# Patient Record
Sex: Male | Born: 1970
Health system: Southern US, Community
[De-identification: ages and names within clinical notes are randomized; demographics above are authoritative.]

## PROBLEM LIST (undated history)

## (undated) DIAGNOSIS — J449 Chronic obstructive pulmonary disease, unspecified: Secondary | ICD-10-CM

## (undated) HISTORY — PX: KNEE CARTILAGE SURGERY: SHX688

---

## 2004-11-30 ENCOUNTER — Emergency Department (HOSPITAL_COMMUNITY): Admission: EM | Admit: 2004-11-30 | Discharge: 2004-11-30 | Payer: Self-pay | Admitting: Emergency Medicine

## 2007-06-09 ENCOUNTER — Emergency Department (HOSPITAL_COMMUNITY): Admission: EM | Admit: 2007-06-09 | Discharge: 2007-06-10 | Payer: Self-pay | Admitting: Emergency Medicine

## 2007-12-08 ENCOUNTER — Emergency Department (HOSPITAL_COMMUNITY): Admission: EM | Admit: 2007-12-08 | Discharge: 2007-12-09 | Payer: Self-pay | Admitting: Emergency Medicine

## 2011-06-02 ENCOUNTER — Ambulatory Visit
Admission: RE | Admit: 2011-06-02 | Discharge: 2011-06-02 | Disposition: A | Discharge status: Home / Self Care | Payer: 59 | Source: Ambulatory Visit | Attending: Chiropractic Medicine | Admitting: Chiropractic Medicine

## 2011-06-02 ENCOUNTER — Other Ambulatory Visit: Payer: Self-pay | Admitting: Chiropractic Medicine

## 2011-06-02 DIAGNOSIS — R52 Pain, unspecified: Secondary | ICD-10-CM

## 2011-06-02 DIAGNOSIS — M545 Low back pain, unspecified: Secondary | ICD-10-CM

## 2011-06-02 DIAGNOSIS — M5412 Radiculopathy, cervical region: Secondary | ICD-10-CM

## 2012-05-04 ENCOUNTER — Other Ambulatory Visit (HOSPITAL_BASED_OUTPATIENT_CLINIC_OR_DEPARTMENT_OTHER): Payer: Self-pay | Admitting: Internal Medicine

## 2012-05-04 ENCOUNTER — Ambulatory Visit (HOSPITAL_BASED_OUTPATIENT_CLINIC_OR_DEPARTMENT_OTHER)
Admission: RE | Admit: 2012-05-04 | Discharge: 2012-05-04 | Disposition: A | Discharge status: Home / Self Care | Payer: 59 | Source: Ambulatory Visit | Attending: Internal Medicine | Admitting: Internal Medicine

## 2012-05-04 ENCOUNTER — Encounter (HOSPITAL_BASED_OUTPATIENT_CLINIC_OR_DEPARTMENT_OTHER): Payer: Self-pay

## 2012-05-04 DIAGNOSIS — Z87442 Personal history of urinary calculi: Secondary | ICD-10-CM | POA: Insufficient documentation

## 2012-05-04 DIAGNOSIS — R52 Pain, unspecified: Secondary | ICD-10-CM

## 2012-05-04 DIAGNOSIS — R300 Dysuria: Secondary | ICD-10-CM | POA: Insufficient documentation

## 2012-05-04 MED ORDER — IOHEXOL 300 MG/ML  SOLN
100.0000 mL | Freq: Once | INTRAMUSCULAR | Status: AC | PRN
  Administered 2012-05-04: 100 mL via INTRAVENOUS

## 2015-07-18 DIAGNOSIS — Z79899 Other long term (current) drug therapy: Secondary | ICD-10-CM | POA: Diagnosis not present

## 2015-07-18 DIAGNOSIS — M5136 Other intervertebral disc degeneration, lumbar region: Secondary | ICD-10-CM | POA: Diagnosis not present

## 2015-07-18 DIAGNOSIS — M545 Low back pain: Secondary | ICD-10-CM | POA: Diagnosis not present

## 2015-07-18 DIAGNOSIS — F411 Generalized anxiety disorder: Secondary | ICD-10-CM | POA: Diagnosis not present

## 2015-07-18 DIAGNOSIS — M542 Cervicalgia: Secondary | ICD-10-CM | POA: Diagnosis not present

## 2015-08-01 DIAGNOSIS — H40053 Ocular hypertension, bilateral: Secondary | ICD-10-CM | POA: Diagnosis not present

## 2015-08-15 DIAGNOSIS — M545 Low back pain: Secondary | ICD-10-CM | POA: Diagnosis not present

## 2015-08-15 DIAGNOSIS — M542 Cervicalgia: Secondary | ICD-10-CM | POA: Diagnosis not present

## 2015-08-15 DIAGNOSIS — Z79899 Other long term (current) drug therapy: Secondary | ICD-10-CM | POA: Diagnosis not present

## 2015-08-15 DIAGNOSIS — M5136 Other intervertebral disc degeneration, lumbar region: Secondary | ICD-10-CM | POA: Diagnosis not present

## 2015-08-15 DIAGNOSIS — M25512 Pain in left shoulder: Secondary | ICD-10-CM | POA: Diagnosis not present

## 2015-09-12 DIAGNOSIS — S43005A Unspecified dislocation of left shoulder joint, initial encounter: Secondary | ICD-10-CM | POA: Diagnosis not present

## 2015-09-12 DIAGNOSIS — Z79899 Other long term (current) drug therapy: Secondary | ICD-10-CM | POA: Diagnosis not present

## 2015-09-12 DIAGNOSIS — M542 Cervicalgia: Secondary | ICD-10-CM | POA: Diagnosis not present

## 2015-09-12 DIAGNOSIS — M5136 Other intervertebral disc degeneration, lumbar region: Secondary | ICD-10-CM | POA: Diagnosis not present

## 2015-09-12 DIAGNOSIS — M545 Low back pain: Secondary | ICD-10-CM | POA: Diagnosis not present

## 2015-10-10 DIAGNOSIS — M5136 Other intervertebral disc degeneration, lumbar region: Secondary | ICD-10-CM | POA: Diagnosis not present

## 2015-10-10 DIAGNOSIS — M542 Cervicalgia: Secondary | ICD-10-CM | POA: Diagnosis not present

## 2015-10-10 DIAGNOSIS — F419 Anxiety disorder, unspecified: Secondary | ICD-10-CM | POA: Diagnosis not present

## 2015-10-10 DIAGNOSIS — M545 Low back pain: Secondary | ICD-10-CM | POA: Diagnosis not present

## 2015-10-10 DIAGNOSIS — Z79899 Other long term (current) drug therapy: Secondary | ICD-10-CM | POA: Diagnosis not present

## 2015-11-07 DIAGNOSIS — M5136 Other intervertebral disc degeneration, lumbar region: Secondary | ICD-10-CM | POA: Diagnosis not present

## 2015-11-07 DIAGNOSIS — M545 Low back pain: Secondary | ICD-10-CM | POA: Diagnosis not present

## 2015-11-07 DIAGNOSIS — M25562 Pain in left knee: Secondary | ICD-10-CM | POA: Diagnosis not present

## 2015-11-07 DIAGNOSIS — M542 Cervicalgia: Secondary | ICD-10-CM | POA: Diagnosis not present

## 2015-11-07 DIAGNOSIS — Z79899 Other long term (current) drug therapy: Secondary | ICD-10-CM | POA: Diagnosis not present

## 2015-12-08 DIAGNOSIS — Z114 Encounter for screening for human immunodeficiency virus [HIV]: Secondary | ICD-10-CM | POA: Diagnosis not present

## 2015-12-08 DIAGNOSIS — Z79899 Other long term (current) drug therapy: Secondary | ICD-10-CM | POA: Diagnosis not present

## 2015-12-08 DIAGNOSIS — R0602 Shortness of breath: Secondary | ICD-10-CM | POA: Diagnosis not present

## 2015-12-08 DIAGNOSIS — Z0000 Encounter for general adult medical examination without abnormal findings: Secondary | ICD-10-CM | POA: Diagnosis not present

## 2016-02-29 DIAGNOSIS — Z0000 Encounter for general adult medical examination without abnormal findings: Secondary | ICD-10-CM | POA: Diagnosis not present

## 2016-05-03 ENCOUNTER — Encounter (HOSPITAL_COMMUNITY): Payer: Self-pay

## 2016-05-03 ENCOUNTER — Emergency Department (HOSPITAL_COMMUNITY): Admission: EM | Admit: 2016-05-03 | Payer: BLUE CROSS/BLUE SHIELD | Source: Home / Self Care

## 2016-05-03 ENCOUNTER — Emergency Department (HOSPITAL_COMMUNITY): Payer: BLUE CROSS/BLUE SHIELD

## 2016-05-03 ENCOUNTER — Emergency Department (HOSPITAL_COMMUNITY)
Admission: EM | Admit: 2016-05-03 | Discharge: 2016-05-03 | Disposition: A | Discharge status: Home / Self Care | Payer: BLUE CROSS/BLUE SHIELD | Attending: Emergency Medicine | Admitting: Emergency Medicine

## 2016-05-03 DIAGNOSIS — R935 Abnormal findings on diagnostic imaging of other abdominal regions, including retroperitoneum: Secondary | ICD-10-CM | POA: Insufficient documentation

## 2016-05-03 DIAGNOSIS — S300XXA Contusion of lower back and pelvis, initial encounter: Secondary | ICD-10-CM | POA: Insufficient documentation

## 2016-05-03 DIAGNOSIS — S0990XA Unspecified injury of head, initial encounter: Secondary | ICD-10-CM | POA: Diagnosis not present

## 2016-05-03 DIAGNOSIS — F17210 Nicotine dependence, cigarettes, uncomplicated: Secondary | ICD-10-CM | POA: Insufficient documentation

## 2016-05-03 DIAGNOSIS — R51 Headache: Secondary | ICD-10-CM | POA: Insufficient documentation

## 2016-05-03 DIAGNOSIS — Y999 Unspecified external cause status: Secondary | ICD-10-CM | POA: Insufficient documentation

## 2016-05-03 DIAGNOSIS — S299XXA Unspecified injury of thorax, initial encounter: Secondary | ICD-10-CM | POA: Diagnosis not present

## 2016-05-03 DIAGNOSIS — S40212A Abrasion of left shoulder, initial encounter: Secondary | ICD-10-CM | POA: Insufficient documentation

## 2016-05-03 DIAGNOSIS — S3992XA Unspecified injury of lower back, initial encounter: Secondary | ICD-10-CM | POA: Insufficient documentation

## 2016-05-03 DIAGNOSIS — Y939 Activity, unspecified: Secondary | ICD-10-CM

## 2016-05-03 DIAGNOSIS — S3991XA Unspecified injury of abdomen, initial encounter: Secondary | ICD-10-CM | POA: Diagnosis not present

## 2016-05-03 DIAGNOSIS — S20312A Abrasion of left front wall of thorax, initial encounter: Secondary | ICD-10-CM | POA: Insufficient documentation

## 2016-05-03 DIAGNOSIS — S279XXA Injury of unspecified intrathoracic organ, initial encounter: Secondary | ICD-10-CM | POA: Diagnosis not present

## 2016-05-03 DIAGNOSIS — S199XXA Unspecified injury of neck, initial encounter: Secondary | ICD-10-CM | POA: Diagnosis not present

## 2016-05-03 DIAGNOSIS — S4992XA Unspecified injury of left shoulder and upper arm, initial encounter: Secondary | ICD-10-CM | POA: Diagnosis present

## 2016-05-03 DIAGNOSIS — M549 Dorsalgia, unspecified: Secondary | ICD-10-CM | POA: Diagnosis not present

## 2016-05-03 DIAGNOSIS — Y92410 Unspecified street and highway as the place of occurrence of the external cause: Secondary | ICD-10-CM

## 2016-05-03 LAB — I-STAT CHEM 8, ED
BUN: 13 mg/dL (ref 6–20)
CALCIUM ION: 1.14 mmol/L — AB (ref 1.15–1.40)
CHLORIDE: 102 mmol/L (ref 101–111)
Creatinine, Ser: 1.2 mg/dL (ref 0.61–1.24)
GLUCOSE: 135 mg/dL — AB (ref 65–99)
HCT: 46 % (ref 39.0–52.0)
Hemoglobin: 15.6 g/dL (ref 13.0–17.0)
Potassium: 3.2 mmol/L — ABNORMAL LOW (ref 3.5–5.1)
SODIUM: 142 mmol/L (ref 135–145)
TCO2: 26 mmol/L (ref 0–100)

## 2016-05-03 LAB — ETHANOL: Alcohol, Ethyl (B): <5 mg/dL (ref <5)

## 2016-05-03 MED ORDER — IOPAMIDOL (ISOVUE-300) INJECTION 61%
INTRAVENOUS | Status: AC
  Filled 2016-05-03: qty 100

## 2016-05-03 MED ORDER — IOPAMIDOL (ISOVUE-300) INJECTION 61%: 100.0000 mL | Freq: Once | INTRAVENOUS | Status: DC | PRN

## 2016-05-03 MED ORDER — METHOCARBAMOL 500 MG PO TABS: 500.0000 mg | ORAL_TABLET | Freq: Three times a day (TID) | ORAL | 0 refills | Status: DC | PRN

## 2016-05-03 MED ORDER — METHOCARBAMOL 500 MG PO TABS
1000.0000 mg | ORAL_TABLET | Freq: Once | ORAL | Status: AC
  Administered 2016-05-03: 1000 mg via ORAL
  Filled 2016-05-03: qty 2

## 2016-05-03 MED ORDER — KETOROLAC TROMETHAMINE 30 MG/ML IJ SOLN
30.0000 mg | Freq: Once | INTRAMUSCULAR | Status: AC
  Administered 2016-05-03: 30 mg via INTRAVENOUS
  Filled 2016-05-03: qty 1

## 2016-05-03 MED ORDER — NAPROXEN 500 MG PO TABS: 500.0000 mg | ORAL_TABLET | Freq: Two times a day (BID) | ORAL | 0 refills | Status: DC
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
## 2016-05-03 NOTE — ED Notes (Signed)
Registration reports chart has merged. No trauma documentation is flowing over at this time. RN notes are crossing over. Registration and Charge RN made aware.

## 2016-05-03 NOTE — ED Notes (Signed)
RN went to chart on patient. Entire pt. Chart has been deleted by registration. Registration working to get back what was lost. EDP and charge RN made aware.
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
## 2016-05-03 NOTE — Progress Notes (Signed)
Orthopedic Tech Progress Note Patient Details:  Kyle Frazier [birth year] [id number]  Patient ID: Kyle Frazier, male   DOB: [birth year], 46 y.o.   MRN: [id number]   [name] 05/03/2016, 6:21 PMLevel 2 Trauma.
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
## 2016-05-03 NOTE — Discharge Instructions (Signed)
Rest. Avoid physical activity for the next few days.  Robaxin as a muscle relaxant, naproxen and anti-inflammatory for her muscle aches and pains.

## 2016-06-24 ENCOUNTER — Telehealth: Payer: Self-pay | Admitting: Behavioral Health

## 2016-06-24 NOTE — Telephone Encounter (Signed)
Unable to reach patient at time of Pre-Visit Call.  Left message for patient to return call when available.    

## 2016-06-25 ENCOUNTER — Ambulatory Visit (INDEPENDENT_AMBULATORY_CARE_PROVIDER_SITE_OTHER): Payer: BLUE CROSS/BLUE SHIELD | Admitting: Medical

## 2016-06-25 ENCOUNTER — Encounter: Payer: Self-pay | Admitting: Medical

## 2016-06-25 VITALS — BP 129/83 | HR 96 | Temp 98.1°F | Ht 68.0 in | Wt 243.6 lb

## 2016-06-25 DIAGNOSIS — M5410 Radiculopathy, site unspecified: Secondary | ICD-10-CM | POA: Diagnosis not present

## 2016-06-25 DIAGNOSIS — F419 Anxiety disorder, unspecified: Secondary | ICD-10-CM | POA: Diagnosis not present

## 2016-06-25 DIAGNOSIS — M25512 Pain in left shoulder: Secondary | ICD-10-CM | POA: Diagnosis not present

## 2016-06-25 DIAGNOSIS — L089 Local infection of the skin and subcutaneous tissue, unspecified: Secondary | ICD-10-CM

## 2016-06-25 DIAGNOSIS — F1910 Other psychoactive substance abuse, uncomplicated: Secondary | ICD-10-CM | POA: Diagnosis not present

## 2016-06-25 DIAGNOSIS — M542 Cervicalgia: Secondary | ICD-10-CM

## 2016-06-25 DIAGNOSIS — G8929 Other chronic pain: Secondary | ICD-10-CM | POA: Diagnosis not present

## 2016-06-25 DIAGNOSIS — M545 Low back pain: Secondary | ICD-10-CM

## 2016-06-25 MED ORDER — DOXYCYCLINE HYCLATE 100 MG PO TABS: 100.0000 mg | ORAL_TABLET | Freq: Two times a day (BID) | ORAL | 0 refills | Status: DC

## 2016-06-25 MED ORDER — METHOCARBAMOL 500 MG PO TABS: 500.0000 mg | ORAL_TABLET | Freq: Three times a day (TID) | ORAL | 0 refills | Status: DC | PRN

## 2016-06-25 MED ORDER — FAMCICLOVIR 500 MG PO TABS: 500.0000 mg | ORAL_TABLET | Freq: Three times a day (TID) | ORAL | 0 refills | Status: DC

## 2016-06-25 MED ORDER — PREGABALIN 100 MG PO CAPS: 100.0000 mg | ORAL_CAPSULE | Freq: Two times a day (BID) | ORAL | 0 refills | Status: DC

## 2016-06-25 MED ORDER — BUDESONIDE-FORMOTEROL FUMARATE 160-4.5 MCG/ACT IN AERO: 2.0000 | INHALATION_SPRAY | Freq: Two times a day (BID) | RESPIRATORY_TRACT | 3 refills | Status: DC

## 2016-06-25 MED ORDER — KETOROLAC TROMETHAMINE 60 MG/2ML IM SOLN
60.0000 mg | Freq: Once | INTRAMUSCULAR | Status: AC
  Administered 2016-06-25: 60 mg via INTRAMUSCULAR

## 2016-06-25 MED FILL — METHOCARBAMOL 500 MG TABLET: 500 | 7 days supply | Qty: 20 | Fill #0

## 2016-06-25 MED FILL — LYRICA 100 MG CAPSULE: 100 | 30 days supply | Qty: 60 | Fill #0
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
## 2016-06-25 NOTE — Patient Instructions (Addendum)
For your anxiety continue the sertraline. I need to review you records and may need to rx other type medications.  For back pain, shoulder pain, and neck pain will give toradol 60 mg im today. (also refilled robaxin)  For rib pain you could have early skin infection vs shingles.(you are in 48 hour time frame from onset). I am prescribing doxycycline antibiotic and famvir. Start famvir today.  For radicular/nerve type pain will rx lyrica.Stop gabapentin since has not helped.  For substance abuse history will refer you to counseling.   Follow up in 2 weeks or as needed

## 2016-06-25 NOTE — Progress Notes (Signed)
Pre visit review using our clinic review tool, if applicable. No additional management support is needed unless otherwise documented below in the visit note. 

## 2016-07-12 ENCOUNTER — Ambulatory Visit (INDEPENDENT_AMBULATORY_CARE_PROVIDER_SITE_OTHER): Payer: BLUE CROSS/BLUE SHIELD | Admitting: Medical

## 2016-07-13 ENCOUNTER — Ambulatory Visit (INDEPENDENT_AMBULATORY_CARE_PROVIDER_SITE_OTHER): Payer: BLUE CROSS/BLUE SHIELD | Admitting: Medical

## 2016-07-13 ENCOUNTER — Encounter: Payer: Self-pay | Admitting: Medical

## 2016-07-13 VITALS — BP 140/96 | HR 81 | Temp 98.3°F | Resp 18 | Ht 68.0 in | Wt 243.8 lb

## 2016-07-13 DIAGNOSIS — M5410 Radiculopathy, site unspecified: Secondary | ICD-10-CM | POA: Diagnosis not present

## 2016-07-13 DIAGNOSIS — S46212A Strain of muscle, fascia and tendon of other parts of biceps, left arm, initial encounter: Secondary | ICD-10-CM

## 2016-07-13 DIAGNOSIS — F419 Anxiety disorder, unspecified: Secondary | ICD-10-CM

## 2016-07-13 DIAGNOSIS — G8929 Other chronic pain: Secondary | ICD-10-CM

## 2016-07-13 DIAGNOSIS — M545 Low back pain: Secondary | ICD-10-CM | POA: Diagnosis not present

## 2016-07-13 DIAGNOSIS — S46112A Strain of muscle, fascia and tendon of long head of biceps, left arm, initial encounter: Secondary | ICD-10-CM | POA: Diagnosis not present

## 2016-07-13 DIAGNOSIS — F1910 Other psychoactive substance abuse, uncomplicated: Secondary | ICD-10-CM | POA: Diagnosis not present

## 2016-07-13 MED ORDER — BUSPIRONE HCL 15 MG PO TABS
15.0000 mg | ORAL_TABLET | Freq: Two times a day (BID) | ORAL | 0 refills | Status: DC
Start: 2016-07-13 — End: 2016-08-12

## 2016-07-13 MED ORDER — MELOXICAM 15 MG PO TABS: 15.0000 mg | ORAL_TABLET | Freq: Every day | ORAL | 0 refills | Status: DC

## 2016-07-13 MED FILL — busPIRone HCL 15 MG TABS: 15 | 30 days supply | Qty: 60 | Fill #0

## 2016-07-13 MED FILL — MELOXICAM 15 MG TABLET: 15 | 30 days supply | Qty: 30 | Fill #0

## 2016-07-13 NOTE — Progress Notes (Signed)
Subjective:    Patient ID: Kyle Frazier, male    DOB: February 17, 1971, 46 y.o.   MRN: [id number]  HPI  Pt in for follow up.  Pt has history of chronic anxiety for over 12 years. I had written sertraline lat time. He states this does not help much. Pt does have appointment with behavioral health on Aug 13, 2016.  Pt still has chronic back pain with some pain that shoots to both left and right side. No weakness to leg, no saddle anesthesia. No weakness. Pt states lyrica does not seem to be helping.  Pt also states trying to lift concrete brick in mud. Suctioned in and he pulled it out.Pt felt burning sensation in his left distal bicep area past week when he pulled on brick. On and off pain lef bicep  for about a week.   Pt rib area pain no with redness went away. Rx'd doxycycline on last visit.  Pt has substance abuse hx. Hx of heroin use.(he still struggles)     Review of Systems  Constitutional: Negative for chills, fatigue and fever.  HENT: Negative for congestion, drooling, facial swelling, postnasal drip, rhinorrhea, sinus pressure and sore throat.   Respiratory: Negative for cough, chest tightness, shortness of breath and wheezing.   Cardiovascular: Negative for chest pain and palpitations.  Gastrointestinal: Negative for abdominal pain, blood in stool, constipation, diarrhea and vomiting.  Genitourinary: Negative for dysuria and flank pain.  Musculoskeletal: Positive for back pain. Negative for myalgias and neck stiffness.       Lt arm pain. Short bicep.  Skin: Negative for rash.  Psychiatric/Behavioral: Negative for behavioral problems, confusion, hallucinations and sleep disturbance. The patient is nervous/anxious.    No past medical history on file.   Social History   Social History  . Marital status: Married    Spouse name: N/A  . Number of children: N/A  . Years of education: N/A   Occupational History  . Not on file.   Social History Main Topics  . Smoking status:  Current Every Day Smoker    Packs/day: 2.00    Types: Cigarettes  . Smokeless tobacco: Never Used  . Alcohol use No  . Drug use: Yes    Types: IV  . Sexual activity: Not on file   Other Topics Concern  . Not on file   Social History Narrative  . No narrative on file    No past surgical history on file.  No family history on file.  Allergies  Allergen Reactions  . Bee Venom Anaphylaxis  . Penicillins Swelling    Has patient had a PCN reaction causing immediate rash, facial/tongue/throat swelling, SOB or lightheadedness with hypotension: YES Has patient had a PCN reaction causing severe rash involving mucus membranes or skin necrosis: NO Has patient had a PCN reaction that required hospitalization. NO Has patient had a PCN reaction occurring within the last 10 years: NO If all of the above answers are "NO", then may proceed with Cephalosporin use.    Current Outpatient Prescriptions on File Prior to Visit  Medication Sig Dispense Refill  . budesonide-formoterol (SYMBICORT) 160-4.5 MCG/ACT inhaler Inhale 2 puffs into the lungs 2 (two) times daily. 1 Inhaler 3  . famciclovir (FAMVIR) 500 MG tablet Take 1 tablet (500 mg total) by mouth 3 (three) times daily. 21 tablet 0  . methocarbamol (ROBAXIN) 500 MG tablet Take 1 tablet (500 mg total) by mouth 3 (three) times daily between meals as needed. 20 tablet 0  .  pregabalin (LYRICA) 100 MG capsule Take 1 capsule (100 mg total) by mouth 2 (two) times daily. 60 capsule 0  . sertraline (ZOLOFT) 50 MG tablet Take 50 mg by mouth daily.     No current facility-administered medications on file prior to visit.     BP (!) 140/96 (BP Location: Right Arm, Cuff Size: Large)   Pulse 81   Temp 98.3 F (36.8 C) (Oral)   Resp 18   Ht  (1.727 m)   Wt 243 lb 12.8 oz (110.6 kg)   SpO2 98% Comment: room air  BMI 37.07 kg/m       Objective:   Physical Exam  General Appearance- Not in acute distress.    Chest and Lung  Exam Auscultation: Breath sounds:-Normal. Clear even and unlabored. Adventitious sounds:- No Adventitious sounds.  Cardiovascular Auscultation:Rythm - Regular, rate and rythm. Heart Sounds -Normal heart sounds.  Abdomen Inspection:-Inspection Normal.  Palpation/Perucssion: Palpation and Percussion of the abdomen reveal- Non Tender, No Rebound tenderness, No rigidity(Guarding) and No Palpable abdominal masses.  Liver:-Normal.  Spleen:- Normal.   Back Mid lumbar spine tenderness to palpation. Pain on straight leg lift. Pain on lateral movements and flexion/extension of the spine.  Left bicep- has short appearing bicep. Bunched up appearance with no effort. Looks like may have injured proximal and distal aspect.       Assessment & Plan:  For anxiety continue sertraline and will add buspar. Attend behavioral health.  For back pain can rx meloxicam. For radicular pain can stop lyrica since you report not helping.(discussed gabapentin but did not help in past either)  For substance abuse recommend attend NA.   For likely bicep injury will refer to orthopedist.  Follow up in one month or as needed

## 2016-07-13 NOTE — Patient Instructions (Addendum)
For anxiety continue sertraline and will add buspar. Attend behavioral health.  For back pain can rx meloxicam. For radicular pain can stop lyrica since you report not helping.(discussed gabapentin use but did not help in past either)  For substance abuse recommend attend [facility].   For likely bicep injury will refer to orthopedist.  Follow up in one month or as needed

## 2016-07-13 NOTE — Progress Notes (Signed)
Pre visit review using our clinic review tool, if applicable. No additional management support is needed unless otherwise documented below in the visit note. 

## 2016-07-14 ENCOUNTER — Encounter: Payer: Self-pay | Admitting: Medical

## 2016-07-30 ENCOUNTER — Ambulatory Visit (INDEPENDENT_AMBULATORY_CARE_PROVIDER_SITE_OTHER): Payer: Self-pay

## 2016-07-30 ENCOUNTER — Ambulatory Visit (INDEPENDENT_AMBULATORY_CARE_PROVIDER_SITE_OTHER): Payer: BLUE CROSS/BLUE SHIELD | Admitting: Orthopaedic Surgery

## 2016-07-30 ENCOUNTER — Encounter (INDEPENDENT_AMBULATORY_CARE_PROVIDER_SITE_OTHER): Payer: Self-pay | Admitting: Orthopaedic Surgery

## 2016-07-30 VITALS — BP 99/61 | HR 64 | Resp 14 | Ht 68.0 in | Wt 240.0 lb

## 2016-07-30 DIAGNOSIS — S46112A Strain of muscle, fascia and tendon of long head of biceps, left arm, initial encounter: Secondary | ICD-10-CM | POA: Diagnosis not present

## 2016-07-30 DIAGNOSIS — S46212A Strain of muscle, fascia and tendon of other parts of biceps, left arm, initial encounter: Secondary | ICD-10-CM

## 2016-07-30 DIAGNOSIS — M542 Cervicalgia: Secondary | ICD-10-CM | POA: Diagnosis not present

## 2016-07-30 DIAGNOSIS — M25512 Pain in left shoulder: Secondary | ICD-10-CM

## 2016-07-30 NOTE — Progress Notes (Signed)
Office Visit Note   Patient: Kyle Frazier           Date of Birth: 1971/03/09           MRN: [id number] Visit Date: 07/30/2016              Requested by: Esperanza Richters, PA-C [address] [place], [place] [address] PCP: Esperanza Richters, PA-C   Assessment & Plan: Visit Diagnoses:  1. Biceps muscle tear, left, initial encounter   2. Acute pain of left shoulder   3. Cervicalgia     Plan: MRI scan left shoulder. Patient has a proximal biceps long head rupture with prior history of shoulder dislocation. Also experiencing some tingling into his left upper extremity which probably originates in the cervical spine. We'll evaluate that over  Time.  Follow-Up Instructions: No Follow-up on file.   Orders:  Orders Placed This Encounter  Procedures  . XR Cervical Spine 2 or 3 views  . XR Shoulder Left   No orders of the defined types were placed in this encounter.     Procedures: No procedures performed   Clinical Data: No additional findings.   Subjective: Chief Complaint  Patient presents with  . Left Upper Arm - Pain, Injury    Kyle Frazier is a 46 y o that  states trying to lift concrete brick in mud. Suctioned in and he pulled it out.Pt felt burning sensation in his left distal bicep area  when he pulled on brick. Pain left bicep for about a 2 weeks. (PCP visit 4/3) getting worse   History as above. Pain was initially experienced in the left shoulder associated with a "Popeye" appearance of his left biceps muscle. He also was had some discomfort and tingling into the index finger of his left nondominant hand. Approximately 1 year ago he dislocated his left shoulder and relates that after several months any residual discomfort resolved after reduction Denies any numbness tingling or pain in his left upper extremity prior to this injury 3 weeks ago HPI  Review of Systems   Objective: Vital Signs: BP 99/61   Pulse 64   Resp 14   Ht  (1.727 m)   Wt 240 lb  (108.9 kg)   BMI 36.49 kg/m   Physical Exam  Ortho Exam is full range of motion left shoulder without any weakness. Biceps muscle is in a distal position. Some pain along the biceps tendon but without ecchymosis. Negative impingement. Negative empty can testing. Good grip and released distally. Might have eczema across the metacarpal phalangeal joints of both hands.  Able to touch his chin to his chest and almost full neck extension. With neck extension he has some referred pain to the left scapula and left shoulder and with prolonged position even some early numbness in his left hand. Full rotation to the right and to the left without difficulty.    Imaging: No results found.   PMFS History: There are no active problems to display for this patient.  No past medical history on file.  No family history on file.  No past surgical history on file. Social History   Occupational History  . Not on file.   Social History Main Topics  . Smoking status: Current Every Day Smoker    Packs/day: 2.00    Types: E-cigarettes  . Smokeless tobacco: Never Used  . Alcohol use No  . Drug use: Yes    Types: Other-see comments  Comment: used some oxycotin he found  . Sexual activity: Not on file

## 2016-08-11 ENCOUNTER — Telehealth (INDEPENDENT_AMBULATORY_CARE_PROVIDER_SITE_OTHER): Payer: Self-pay | Admitting: Orthopaedic Surgery

## 2016-08-11 NOTE — Telephone Encounter (Signed)
Have him come in 2-3 days after the study

## 2016-08-11 NOTE — Telephone Encounter (Signed)
Patient has MRI scheduled for 5/4. First available rov appt is 5/21. Patient request a call with MRI results if possible.

## 2016-08-11 NOTE — Telephone Encounter (Signed)
Would you please call him and schedule? Thank you

## 2016-08-11 NOTE — Telephone Encounter (Signed)
Please advise 

## 2016-08-12 ENCOUNTER — Other Ambulatory Visit: Payer: Self-pay | Admitting: Medical

## 2016-08-12 ENCOUNTER — Ambulatory Visit: Payer: BLUE CROSS/BLUE SHIELD | Admitting: Psychiatry

## 2016-08-13 ENCOUNTER — Ambulatory Visit
Admission: RE | Admit: 2016-08-13 | Discharge: 2016-08-13 | Disposition: A | Discharge status: Home / Self Care | Payer: BLUE CROSS/BLUE SHIELD | Source: Ambulatory Visit | Attending: Orthopaedic Surgery | Admitting: Orthopaedic Surgery

## 2016-08-13 DIAGNOSIS — S46212A Strain of muscle, fascia and tendon of other parts of biceps, left arm, initial encounter: Secondary | ICD-10-CM

## 2016-08-13 DIAGNOSIS — S46112A Strain of muscle, fascia and tendon of long head of biceps, left arm, initial encounter: Secondary | ICD-10-CM | POA: Diagnosis not present

## 2016-08-16 ENCOUNTER — Ambulatory Visit: Payer: BLUE CROSS/BLUE SHIELD | Admitting: Medical

## 2016-08-17 MED ORDER — BUSPIRONE HCL 15 MG PO TABS: 15.0000 mg | ORAL_TABLET | Freq: Two times a day (BID) | ORAL | 0 refills | Status: DC

## 2016-08-17 MED FILL — busPIRone HCL 15 MG TABS: 15 | 30 days supply | Qty: 60 | Fill #0

## 2016-08-17 NOTE — Telephone Encounter (Signed)
Pt is requesting refill on Lyrica, and famciclovir.  Last OV: 07/13/2016 Last Fill on Lyrica: 06/25/2016 #60 and 0RF Last Fill on famciclovir: 06/25/2016 #21 and 0RF   Please advise.

## 2016-08-17 NOTE — Telephone Encounter (Signed)
Would you ask him to make appointment. I reviewed notes and he states lyrica did not help. Also I don't think he needs famciclovir any more. I treated early shingles in past and that was 2 months ago approximate. Please have him make appointment to discuss.

## 2016-08-20 ENCOUNTER — Telehealth (INDEPENDENT_AMBULATORY_CARE_PROVIDER_SITE_OTHER): Payer: Self-pay | Admitting: Orthopaedic Surgery

## 2016-08-20 NOTE — Telephone Encounter (Signed)
Opened in error

## 2016-08-24 ENCOUNTER — Ambulatory Visit (INDEPENDENT_AMBULATORY_CARE_PROVIDER_SITE_OTHER): Payer: BLUE CROSS/BLUE SHIELD | Admitting: Orthopaedic Surgery

## 2016-08-24 ENCOUNTER — Encounter (INDEPENDENT_AMBULATORY_CARE_PROVIDER_SITE_OTHER): Payer: Self-pay | Admitting: Orthopaedic Surgery

## 2016-08-24 ENCOUNTER — Other Ambulatory Visit (INDEPENDENT_AMBULATORY_CARE_PROVIDER_SITE_OTHER): Payer: Self-pay | Admitting: Orthopedic Surgery

## 2016-08-24 DIAGNOSIS — M19012 Primary osteoarthritis, left shoulder: Secondary | ICD-10-CM | POA: Diagnosis not present

## 2016-08-24 DIAGNOSIS — S46112D Strain of muscle, fascia and tendon of long head of biceps, left arm, subsequent encounter: Secondary | ICD-10-CM | POA: Diagnosis not present

## 2016-08-24 DIAGNOSIS — M25512 Pain in left shoulder: Secondary | ICD-10-CM | POA: Diagnosis not present

## 2016-08-24 DIAGNOSIS — R200 Anesthesia of skin: Secondary | ICD-10-CM | POA: Diagnosis not present

## 2016-08-24 DIAGNOSIS — R202 Paresthesia of skin: Secondary | ICD-10-CM | POA: Diagnosis not present

## 2016-08-24 NOTE — Progress Notes (Signed)
Office Visit Note   Patient: Kyle Frazier           Date of Birth: May 02, 1970           MRN: [id number] Visit Date: 08/24/2016              Requested by: Esperanza RichtersSaguier, Edward, PA-C [address] [place], [place] [id number] PCP: Esperanza RichtersSaguier, Edward, PA-C   Assessment & Plan: Visit Diagnoses:  1. Left shoulder pain, unspecified chronicity   2. Numbness and tingling in both hands   3. Arthritis of left acromioclavicular joint   4. Labral tear of long head of left biceps tendon, subsequent encounter     Plan:  #1: Physical therapy visit for home exercise program #2: EMGs bilateral upper extremities rule out ulnar nerve neuropathy #3: We have gone over his MRI scan and if he would like to proceed with a type of surgical intervention and would be a subacromial decompression with distal clavicle excision with possible repair of the biceps tendon tear that of a tenodesis or cuff repair if indicated the time of surgery. Procedures and benefits were explained to him at this time.   Follow-Up Instructions: Return in about 3 weeks (around 09/14/2016).   Orders:  Orders Placed This Encounter  Procedures  . Ambulatory referral to Physical Medicine Rehab  . Ambulatory referral to Physical Therapy   No orders of the defined types were placed in this encounter.     Procedures: No procedures performed   Clinical Data: No additional findings.   Subjective: Chief Complaint  Patient presents with  . Left Shoulder - Follow-up    Mr. Kyle Frazier is a 46 year old white male who is seen today for evaluation of his left shoulder as well as review of his MRI scan. History as above the 3-4 weeks ago he was trying to pick up a concrete block that was stuck in the mud. He was suctioned holding into the mud and as he pulled it out he felt a burning sensation in the proximal biceps area as he pulled the brick out. It has been getting worse. He was evaluated and was noted to have a Popeye appearance of  the left biceps muscle with discomfort. He is also tingling into his index finger. Also complains of numbness in an ulnar nerve distribution bilaterally. He states that he dislocated his left shoulder approximately one year ago with persistent residual discomfort which has resolved.  An MRI was ordered of the left shoulder and he returns today for review of the results.       Review of Systems  Constitutional: Negative.   HENT: Negative.   Respiratory: Negative.   Cardiovascular: Negative.   Gastrointestinal: Negative.   Genitourinary: Negative.   Skin: Negative.   Neurological: Negative.   Hematological: Negative.   Psychiatric/Behavioral: The patient is nervous/anxious.      Objective: Vital Signs: There were no vitals taken for this visit.  Physical Exam  Constitutional: He is oriented to person, place, and time. He appears well-developed and well-nourished.  HENT:  Head: Normocephalic and atraumatic.  Eyes: EOM are normal. Pupils are equal, round, and reactive to light.  Pulmonary/Chest: Effort normal.  Neurological: He is alert and oriented to person, place, and time.  Skin: Skin is warm and dry.  Psychiatric: He has a normal mood and affect. His behavior is normal. Judgment and thought content normal.    Ortho Exam  Exam today reveals near full range of motion of  the left shoulder up. He does have some tenderness along the proximal portion of the previously attached biceps tendon. I cannot get in a timely impingement sign. Negative empty can testing. Good strength otherwise.  He is able touch his chin to his chest and almost pulled backward extension. He does have some referred pain to the scapula and left shoulder with Positioning. Some early numbness into his left hand is noted.  Specialty Comments:  No specialty comments available.  Imaging: Mr Shoulder Left Wo Contrast  Result Date: 08/13/2016 CLINICAL DATA:  Left shoulder and arm pain for 1 month since  picking up a cinder block. Biceps tendon tear. EXAM: MRI OF THE LEFT SHOULDER WITHOUT CONTRAST TECHNIQUE: Multiplanar, multisequence MR imaging of the shoulder was performed. No intravenous contrast was administered. COMPARISON:  None. FINDINGS: Rotator cuff: There is thickening and heterogeneously increased T2 signal in the supraspinatus and infraspinatus tendons consistent with tendinopathy. A very small interstitial tear of the supraspinatus is identified. There is no full-thickness tear or retracted tendon. Muscles:  Normal in appearance without atrophy or focal lesion. Biceps long head: The tendon is completely torn from the superior labrum and retracted approximately 4 cm below the top of the humeral head. Acromioclavicular Joint: The patient has bulky acromioclavicular osteoarthritis with marked marrow edema about the joint. Type 1 acromion. No subacromial/subdeltoid bursal fluid. Glenohumeral Joint: Negative. Labrum:  Intact. Bones:  No fracture or worrisome lesion. Other: None. IMPRESSION: Complete tear of the long head of biceps from the superior labrum. Supraspinatus and infraspinatus tendinopathy with a small interstitial tear of the supraspinatus. No full-thickness tear or retracted tendon. 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 Electronically Signed   By: Drusilla Kanner M.D.   On: 08/13/2016 08:32    PMFS History: There are no active problems to display for this patient.  No past medical history on file.  No family history on file.  No past surgical history on file. Social History   Occupational History  . Not on file.   Social History Main Topics  . Smoking status: Current Every Day Smoker    Packs/day: 2.00    Types: E-cigarettes  . Smokeless tobacco: Never Used  . Alcohol use No  . Drug use: Yes    Types: Other-see comments     Comment: used some oxycotin he found  . Sexual activity: Not on  file

## 2016-08-30 ENCOUNTER — Telehealth (INDEPENDENT_AMBULATORY_CARE_PROVIDER_SITE_OTHER): Payer: Self-pay | Admitting: Orthopaedic Surgery

## 2016-08-30 NOTE — Telephone Encounter (Signed)
LVM with pt to call to schedule surgery. Will try pt again at a later time. 

## 2016-09-02 ENCOUNTER — Telehealth (INDEPENDENT_AMBULATORY_CARE_PROVIDER_SITE_OTHER): Payer: Self-pay | Admitting: Orthopaedic Surgery

## 2016-09-02 NOTE — Telephone Encounter (Signed)
LVM with pt to call to schedule surgery. Will try pt again at a later time. 

## 2016-09-07 ENCOUNTER — Telehealth (INDEPENDENT_AMBULATORY_CARE_PROVIDER_SITE_OTHER): Payer: Self-pay | Admitting: Orthopaedic Surgery

## 2016-09-07 NOTE — Telephone Encounter (Signed)
LVM x3 to schedule pts surgery. Will await pts phone call.

## 2016-09-08 ENCOUNTER — Encounter (INDEPENDENT_AMBULATORY_CARE_PROVIDER_SITE_OTHER): Payer: BLUE CROSS/BLUE SHIELD | Admitting: Physical Medicine and Rehabilitation

## 2016-09-24 ENCOUNTER — Telehealth: Payer: Self-pay | Admitting: Medical

## 2016-09-24 ENCOUNTER — Other Ambulatory Visit: Payer: Self-pay | Admitting: Medical

## 2016-09-24 MED ORDER — BUSPIRONE HCL 15 MG PO TABS: 15.0000 mg | ORAL_TABLET | Freq: Two times a day (BID) | ORAL | 0 refills | Status: DC

## 2016-09-24 MED FILL — MELOXICAM 15 MG TABLET: 15 | 30 days supply | Qty: 30 | Fill #0

## 2016-09-24 MED FILL — busPIRone HCL 15 MG TABS: 15 | 15 days supply | Qty: 30 | Fill #0

## 2016-09-24 NOTE — Telephone Encounter (Signed)
Caller name:Ray Adorno Relationship to patient: Can be reached: Pharmacy:[facility]  Reason for call:requesting refill on buspar 15mg 

## 2016-09-24 NOTE — Telephone Encounter (Signed)
Patient walked in and is going to wait downstairs

## 2016-09-24 NOTE — Telephone Encounter (Signed)
Patient scheduled for 10/08/2016

## 2016-09-24 NOTE — Telephone Encounter (Signed)
scheduled follow up for 10/07/16. Send two week supply of medication to pharmacy.

## 2016-09-24 NOTE — Telephone Encounter (Signed)
Pt is due for follow up please call and schedule appointment.  

## 2016-09-30 ENCOUNTER — Encounter (INDEPENDENT_AMBULATORY_CARE_PROVIDER_SITE_OTHER): Payer: BLUE CROSS/BLUE SHIELD | Admitting: Physical Medicine and Rehabilitation

## 2016-09-30 ENCOUNTER — Telehealth (INDEPENDENT_AMBULATORY_CARE_PROVIDER_SITE_OTHER): Payer: Self-pay

## 2016-09-30 NOTE — Telephone Encounter (Signed)
[facility] sent a fax stating pt cancelled appointment and did not reschedule. (4 times)

## 2016-10-08 ENCOUNTER — Encounter: Payer: Self-pay | Admitting: Medical

## 2016-10-08 ENCOUNTER — Ambulatory Visit (INDEPENDENT_AMBULATORY_CARE_PROVIDER_SITE_OTHER): Payer: BLUE CROSS/BLUE SHIELD | Admitting: Medical

## 2016-10-08 ENCOUNTER — Telehealth: Payer: Self-pay | Admitting: Medical

## 2016-10-08 VITALS — BP 123/79 | HR 71 | Temp 98.1°F | Resp 16 | Ht 68.0 in | Wt 246.8 lb

## 2016-10-08 DIAGNOSIS — S46212D Strain of muscle, fascia and tendon of other parts of biceps, left arm, subsequent encounter: Secondary | ICD-10-CM

## 2016-10-08 DIAGNOSIS — F419 Anxiety disorder, unspecified: Secondary | ICD-10-CM | POA: Diagnosis not present

## 2016-10-08 DIAGNOSIS — F1910 Other psychoactive substance abuse, uncomplicated: Secondary | ICD-10-CM | POA: Diagnosis not present

## 2016-10-08 DIAGNOSIS — S46112D Strain of muscle, fascia and tendon of long head of biceps, left arm, subsequent encounter: Secondary | ICD-10-CM | POA: Diagnosis not present

## 2016-10-08 MED ORDER — CYCLOBENZAPRINE HCL 5 MG PO TABS: 5.0000 mg | ORAL_TABLET | Freq: Every day | ORAL | 0 refills | Status: DC

## 2016-10-08 MED ORDER — ONDANSETRON 8 MG PO TBDP: 8.0000 mg | ORAL_TABLET | Freq: Three times a day (TID) | ORAL | 0 refills | Status: DC | PRN

## 2016-10-08 MED ORDER — BUSPIRONE HCL 30 MG PO TABS: ORAL_TABLET | ORAL | 0 refills | Status: DC

## 2016-10-08 NOTE — Patient Instructions (Signed)
For anxiety continue sertraline and buspar. Refill buspar today.  For substance abuse hx will try to refer you to addiction specialist/methadone clinic. You have concern for withdrawal if stop use. I will rx zofran if you get nausea and flexeril low dose if muscle spasms. You refuse in patient care. If any severe symptoms then ED eval.  See ortho and follow advise on bicep tear.  Follow up with me in 2 weeks or as needed

## 2016-10-08 NOTE — Progress Notes (Signed)
Subjective:    Patient ID: Kyle Frazier, male    DOB: [birth year], 46 y.o.   MRN: [id number]  HPI  Pt in for follow up.  He has history of anxiety. He has been on sertraline and buspar. He is still struggling with drug use.   Pt states he is still struggling with drug use fpr years with failed attempts to stop(went to various in patient treatment centers). He uses heroin. He know wants to get helped and wants to consider methadone.(In the past when he was in inpatient rehab was on suboxone and then states methadone as well). Pt admits last time used heroine this am.  Pt states he struggles with withdrawal and then uses again. Pt is concerned his wife may leave him and he does not want in past treatment.  Pt is followed by ortho for left bicep muscle tear.  Pt does admit depressed. He states in the past when he goes through withdrawal he will become despondant and wander if worth living. He specifically denies feeling like this presently. Though he fears he may feel like this again if he were to experieince withdrawal symptoms.     Review of Systems  Constitutional: Negative for chills and fatigue.  Respiratory: Negative for cough, chest tightness, shortness of breath and wheezing.   Cardiovascular: Negative for chest pain and palpitations.  Gastrointestinal: Negative for abdominal pain, blood in stool, constipation, diarrhea, nausea and vomiting.  Musculoskeletal: Negative for back pain.  Skin: Negative for rash.  Neurological: Negative for dizziness and headaches.  Hematological: Negative for adenopathy. Does not bruise/bleed easily.  Psychiatric/Behavioral: Positive for dysphoric mood. Negative for behavioral problems, confusion, sleep disturbance and suicidal ideas. The patient is nervous/anxious.        No suicidal ideation though he admits to me when he in past went through withdrawal he would feel this way. I explained to him if he did feel like this he needs to be seen by psych  [place]. He states does not feel like this. I gave him my cell phone number to call me if he needs to talk. He declines any in pt treatment for drug abuse as he failed all prior attemps in past.    No past medical history on file.   Social History   Social History  . Marital status: Married    Spouse name: N/A  . Number of children: N/A  . Years of education: N/A   Occupational History  . Not on file.   Social History Main Topics  . Smoking status: Current Every Day Smoker    Packs/day: 2.00    Types: E-cigarettes  . Smokeless tobacco: Never Used  . Alcohol use No  . Drug use: Yes    Types: Other-see comments     Comment: used some oxycotin he found  . Sexual activity: Not on file   Other Topics Concern  . Not on file   Social History Narrative  . No narrative on file    No past surgical history on file.  No family history on file.  Allergies  Allergen Reactions  . Bee Venom Anaphylaxis  . Penicillins Swelling    Has patient had a PCN reaction causing immediate rash, facial/tongue/throat swelling, SOB or lightheadedness with hypotension: YES Has patient had a PCN reaction causing severe rash involving mucus membranes or skin necrosis: NO Has patient had a PCN reaction that required hospitalization. NO Has patient had a PCN reaction occurring within the last 10  years: NO If all of the above answers are "NO", then may proceed with Cephalosporin use.    Current Outpatient Prescriptions on File Prior to Visit  Medication Sig Dispense Refill  . budesonide-formoterol (SYMBICORT) 160-4.5 MCG/ACT inhaler Inhale 2 puffs into the lungs 2 (two) times daily. 1 Inhaler 3  . busPIRone (BUSPAR) 15 MG tablet Take 1 tablet (15 mg total) by mouth 2 (two) times daily. 30 tablet 0  . famciclovir (FAMVIR) 500 MG tablet Take 1 tablet (500 mg total) by mouth 3 (three) times daily. 21 tablet 0  . meloxicam (MOBIC) 15 MG tablet TAKE 1 TABLET (15 MG TOTAL) BY MOUTH DAILY. 30 tablet  0  . methocarbamol (ROBAXIN) 500 MG tablet Take 1 tablet (500 mg total) by mouth 3 (three) times daily between meals as needed. 20 tablet 0  . pregabalin (LYRICA) 100 MG capsule Take 1 capsule (100 mg total) by mouth 2 (two) times daily. 60 capsule 0  . sertraline (ZOLOFT) 50 MG tablet Take 50 mg by mouth daily.     No current facility-administered medications on file prior to visit.     BP 123/79 (BP Location: Right Arm, Patient Position: Sitting, Cuff Size: Large)   Pulse 71   Temp 98.1 F (36.7 C) (Oral)   Resp 16   Ht 5\' 8"  (1.727 m)   Wt 246 lb 12.8 oz (111.9 kg)   SpO2 97%   BMI 37.53 kg/m       Objective:   Physical Exam  General Mental Status- Alert. General Appearance- Not in acute distress.   Skin General: Color- Normal Color. Moisture- Normal Moisture.  Neck Carotid Arteries- Normal color. Moisture- Normal Moisture. No carotid bruits. No JVD.  Chest and Lung Exam Auscultation: Breath Sounds:-Normal.  Cardiovascular Auscultation:Rythm- Regular. Murmurs & Other Heart Sounds:Auscultation of the heart reveals- No Murmurs.  Abdomen Inspection:-Inspeection Normal. Palpation/Percussion:Note:No mass. Palpation and Percussion of the abdomen reveal- Non Tender, Non Distended + BS, no rebound or guarding.   Neurologic Cranial Nerve exam:- CN III-XII intact(No nystagmus), symmetric smile. Drift Test:- No drift. Romberg Exam:- Negative.  Heal to Toe Gait exam:-Normal. Finger to Nose:- Normal/Intact Strength:- 5/5 equal and symmetric strength both upper and lower extremities.      Assessment & Plan:  For anxiety continue sertraline and buspar. Refill buspar today.  For substance abuse hx will try to refer you to addiction specialist/methadone clinic. You have concern for withdrawal if stop use. I will rx zofran if you get nausea and flexeril low dose if muscle spasms. You refuse in patient care. If any severe symptoms then ED eval.  See ortho and follow  advise on bicep tear.  Follow up with me in 2 weeks or as needed  Nikiyah Fackler, Ramon Dredge, [place]

## 2016-10-09 NOTE — Telephone Encounter (Signed)
Opened in error

## 2016-10-22 ENCOUNTER — Ambulatory Visit (INDEPENDENT_AMBULATORY_CARE_PROVIDER_SITE_OTHER): Payer: BLUE CROSS/BLUE SHIELD | Admitting: Medical

## 2016-10-22 ENCOUNTER — Telehealth: Payer: Self-pay | Admitting: Medical

## 2016-10-22 ENCOUNTER — Encounter: Payer: Self-pay | Admitting: Medical

## 2016-10-22 VITALS — BP 145/81 | HR 72 | Temp 98.2°F | Ht 68.0 in | Wt 243.6 lb

## 2016-10-22 DIAGNOSIS — F1910 Other psychoactive substance abuse, uncomplicated: Secondary | ICD-10-CM

## 2016-10-22 MED ORDER — ONDANSETRON 8 MG PO TBDP: 8.0000 mg | ORAL_TABLET | Freq: Three times a day (TID) | ORAL | 0 refills | Status: DC | PRN

## 2016-10-22 MED ORDER — CYCLOBENZAPRINE HCL 5 MG PO TABS
5.0000 mg | ORAL_TABLET | Freq: Every day | ORAL | 0 refills | Status: DC
Start: 2016-10-22 — End: 2017-11-23

## 2016-10-22 NOTE — Patient Instructions (Addendum)
For your substance abuse history we called and asked for [place] [facility] to send records.  I also have sent name and number of another clinic to our referral staff.  Hopefully that clinci might see you quicker once we have records to send.  Hopefully the staff member we both talked to will send asap within 30 minutes. [phone].  If not will ask Victorino DikeJennifer to call them again if we don't receive.  I will refill the zofran and flexeril again if you attempt to stop and go through withdrawals. Keep in mind in patient treatment woudd help you through  Withdrawal better but you decline this.  Follow up 2 weeks or as needed

## 2016-10-22 NOTE — Telephone Encounter (Signed)
Then number of that clinic that might do suboxone is [phone].(name Kyle Frazier).

## 2016-10-22 NOTE — Progress Notes (Signed)
Subjective:    Patient ID: Kyle Frazier, male    DOB: 05/29/1970, 46 y.o.   MRN: [id number]  HPI  Pt in for follow up.  Pt admits that he has used heroin since last seen. Pt admits that he uses by himself. He states withdrawal so bad he can't go through. Pt states when he was in [place] in patient treatment he was on suboxone. He states it was adequate to control the urge and prevent withdrawal. Pt state he uses heroind  to avoid withdrawal.      Review of Systems  Constitutional: Negative for chills, diaphoresis and fatigue.  HENT: Negative for congestion and ear pain.   Respiratory: Negative for cough, chest tightness, shortness of breath and wheezing.   Cardiovascular: Negative for chest pain and palpitations.  Gastrointestinal: Negative for abdominal pain, blood in stool, constipation, diarrhea and vomiting.  Genitourinary: Negative for dysuria, flank pain, frequency and testicular pain.  Musculoskeletal: Negative for back pain, gait problem, myalgias and neck stiffness.  Neurological: Negative for dizziness, syncope, speech difficulty, weakness, numbness and headaches.  Hematological: Negative for adenopathy. Does not bruise/bleed easily.  Psychiatric/Behavioral: Negative for behavioral problems and confusion.       Not reporting any suicidal ideations. Mild anxious.    No past medical history on file.   Social History   Social History  . Marital status: Married    Spouse name: N/A  . Number of children: N/A  . Years of education: N/A   Occupational History  . Not on file.   Social History Main Topics  . Smoking status: Current Every Day Smoker    Packs/day: 2.00    Types: E-cigarettes  . Smokeless tobacco: Never Used  . Alcohol use No  . Drug use: Yes    Types: Other-see comments     Comment: used some oxycotin he found  . Sexual activity: Not on file   Other Topics Concern  . Not on file   Social History Narrative  . No narrative on file    No past  surgical history on file.  No family history on file.  Allergies  Allergen Reactions  . Bee Venom Anaphylaxis  . Penicillins Swelling    Has patient had a PCN reaction causing immediate rash, facial/tongue/throat swelling, SOB or lightheadedness with hypotension: YES Has patient had a PCN reaction causing severe rash involving mucus membranes or skin necrosis: NO Has patient had a PCN reaction that required hospitalization. NO Has patient had a PCN reaction occurring within the last 10 years: NO If all of the above answers are "NO", then may proceed with Cephalosporin use.    Current Outpatient Prescriptions on File Prior to Visit  Medication Sig Dispense Refill  . budesonide-formoterol (SYMBICORT) 160-4.5 MCG/ACT inhaler Inhale 2 puffs into the lungs 2 (two) times daily. 1 Inhaler 3  . busPIRone (BUSPAR) 30 MG tablet 1 tab po bid 60 tablet 0  . cyclobenzaprine (FLEXERIL) 5 MG tablet Take 1 tablet (5 mg total) by mouth at bedtime. 10 tablet 0  . famciclovir (FAMVIR) 500 MG tablet Take 1 tablet (500 mg total) by mouth 3 (three) times daily. 21 tablet 0  . meloxicam (MOBIC) 15 MG tablet TAKE 1 TABLET (15 MG TOTAL) BY MOUTH DAILY. 30 tablet 0  . methocarbamol (ROBAXIN) 500 MG tablet Take 1 tablet (500 mg total) by mouth 3 (three) times daily between meals as needed. 20 tablet 0  . ondansetron (ZOFRAN ODT) 8 MG disintegrating tablet Take 1  tablet (8 mg total) by mouth every 8 (eight) hours as needed for nausea or vomiting. 20 tablet 0  . pregabalin (LYRICA) 100 MG capsule Take 1 capsule (100 mg total) by mouth 2 (two) times daily. 60 capsule 0  . sertraline (ZOLOFT) 50 MG tablet Take 50 mg by mouth daily.     No current facility-administered medications on file prior to visit.     BP (!) 145/81 (BP Location: Left Arm, Patient Position: Sitting, Cuff Size: Large)   Pulse 72   Temp 98.2 F (36.8 C) (Oral)   Ht 5\' 8"  (1.727 m)   Wt 243 lb 9.6 oz (110.5 kg)   SpO2 99%   BMI 37.04 kg/m         Objective:   Physical Exam  General Mental Status- Alert. General Appearance- Not in acute distress.   Skin General: Color- Normal Color. Moisture- Normal Moisture.  Neck Carotid Arteries- Normal color. Moisture- Normal Moisture. No carotid bruits. No JVD.  Chest and Lung Exam Auscultation: Breath Sounds:-Normal.  Cardiovascular Auscultation:Rythm- Regular. Murmurs & Other Heart Sounds:Auscultation of the heart reveals- No Murmurs.  Abdomen Inspection:-Inspeection Normal.Palpation/Percussion:Note:No mass. Palpation and Percussion of the abdomen reveal- Non Tender, Non Distended + BS, no rebound or guarding.  Neurologic Cranial Nerve exam:- CN III-XII intact(No nystagmus), symmetric smile. Strength:- 5/5 equal and symmetric strength both upper and lower extremities.      Assessment & Plan:  For your substance abuse history we called and asked for [place] clinic to send records.  I also have sent name and number of another clinic to our referral staff.  Hopefully that clinci might see you quicker once we have records to send.  Hopefully the staff member we both talked to will send asap within 30 minutes. [phone].  If not will ask Victorino Dike to call them again if we don't receive.  I will refill the zofran and flexeril again if you attempt to stop and go through withdrawals. Keep in mind in patient treatment woudd help you through  Withdrawal better but you decline this.  Follow up 2 weeks or as needed  Nika Yazzie, Ramon Dredge, PA-C

## 2016-10-26 ENCOUNTER — Telehealth: Payer: Self-pay | Admitting: Medical

## 2016-10-26 NOTE — Telephone Encounter (Signed)
Lm on vm w/ new patient intake coordinator(Mackenzie) awaiting return call

## 2016-10-26 NOTE — Telephone Encounter (Signed)
We got his reocords from inpatient rehab/detox. Would you see how fast pain management or suboxone clinic can see pt(see referral I placed). Would you refer to most [facility] if they can see him quickly.  Thanks. Will you give me update by this Friday.I am leaving his records/notes we received on your keyboard. Then will you place records in my scan area.   Just want you to make copies and send off. Otherwise they it will take forever in scanning/not available.

## 2016-10-26 NOTE — Telephone Encounter (Signed)
Lm on vm w/ new patient intake coordinator(Mackenzie) at [facility] [phone] awaiting return call

## 2016-10-27 NOTE — Telephone Encounter (Signed)
Spoke with Thea SilversmithMackenzie, she will fax over referral form

## 2016-11-04 ENCOUNTER — Encounter: Payer: Self-pay | Admitting: Medical

## 2016-11-04 ENCOUNTER — Ambulatory Visit (INDEPENDENT_AMBULATORY_CARE_PROVIDER_SITE_OTHER): Payer: BLUE CROSS/BLUE SHIELD | Admitting: Medical

## 2016-11-04 VITALS — BP 135/80 | HR 73 | Temp 98.1°F | Resp 16 | Ht 68.0 in | Wt 244.6 lb

## 2016-11-04 DIAGNOSIS — L0390 Cellulitis, unspecified: Secondary | ICD-10-CM

## 2016-11-04 DIAGNOSIS — F419 Anxiety disorder, unspecified: Secondary | ICD-10-CM

## 2016-11-04 DIAGNOSIS — F1910 Other psychoactive substance abuse, uncomplicated: Secondary | ICD-10-CM | POA: Diagnosis not present

## 2016-11-04 MED ORDER — DOXYCYCLINE HYCLATE 100 MG PO TABS: 100.0000 mg | ORAL_TABLET | Freq: Two times a day (BID) | ORAL | 0 refills | Status: DC

## 2016-11-04 MED ORDER — BUSPIRONE HCL 30 MG PO TABS
ORAL_TABLET | ORAL | 0 refills | Status: DC
Start: 2016-11-04 — End: 2017-11-23

## 2016-11-04 NOTE — Patient Instructions (Addendum)
I am relieved that you are going to see a specialist this coming week for substance abuse.  For your anxiety I  am prescribing BuSpar again. Do recommend that you ask the substance abuse specialist if they will write in the future or if I will.  You do have area of cellulitis/infection in the groin area.  Prescribing doxycycline antibiotic. Remember advise regarding not to take the antibiotic on empty stomach. Avoid prolonged direct sun exposure as well while on antibiotic for area of infection. If area changes  abscess-like as described then recommend evaluation for possible incision and drainage. That is not presently.  Follow-up in 7-10 days or as needed.

## 2016-11-04 NOTE — Progress Notes (Signed)
Subjective:    Patient ID: Kyle Frazier, male    DOB: 09-24-1970, 46 y.o.   MRN: [id number]  HPI  Pt in for follow up.   I got his records from prior rehab clinic. He has appointment this Monday with psychologist and case worker on Monday. Then will see (specialist)Dr. on Tuesday. He may start suboxone or methadone.  Buspar did help anxiety recently but he is about to run out. The higher dose is helping his anxiety.  Pt also has left groin area of induration, tenderness, and warmth. Started on Friday. No fevers. No discharge from the area. No history of prior skin infection report     Review of Systems  Constitutional: Negative for chills, fatigue and fever.  Respiratory: Negative for cough, chest tightness, shortness of breath and wheezing.   Cardiovascular: Negative for chest pain and palpitations.  Gastrointestinal: Negative for abdominal pain.  Musculoskeletal: Negative for back pain and joint swelling.  Skin: Negative for rash.       Left groin- area of tenderness.  Neurological: Negative for dizziness, seizures and weakness.  Hematological: Negative for adenopathy. Does not bruise/bleed easily.  Psychiatric/Behavioral: Negative for behavioral problems, confusion, sleep disturbance and suicidal ideas. The patient is nervous/anxious.     No past medical history on file.   Social History   Social History  . Marital status: Married    Spouse name: N/A  . Number of children: N/A  . Years of education: N/A   Occupational History  . Not on file.   Social History Main Topics  . Smoking status: Current Every Day Smoker    Packs/day: 2.00    Types: E-cigarettes  . Smokeless tobacco: Never Used  . Alcohol use No  . Drug use: Yes    Types: Other-see comments     Comment: used some oxycotin he found  . Sexual activity: Not on file   Other Topics Concern  . Not on file   Social History Narrative  . No narrative on file    No past surgical history on file.  No  family history on file.  Allergies  Allergen Reactions  . Bee Venom Anaphylaxis  . Penicillins Swelling    Has patient had a PCN reaction causing immediate rash, facial/tongue/throat swelling, SOB or lightheadedness with hypotension: YES Has patient had a PCN reaction causing severe rash involving mucus membranes or skin necrosis: NO Has patient had a PCN reaction that required hospitalization. NO Has patient had a PCN reaction occurring within the last 10 years: NO If all of the above answers are "NO", then may proceed with Cephalosporin use.    Current Outpatient Prescriptions on File Prior to Visit  Medication Sig Dispense Refill  . budesonide-formoterol (SYMBICORT) 160-4.5 MCG/ACT inhaler Inhale 2 puffs into the lungs 2 (two) times daily. 1 Inhaler 3  . busPIRone (BUSPAR) 30 MG tablet 1 tab po bid 60 tablet 0  . cyclobenzaprine (FLEXERIL) 5 MG tablet Take 1 tablet (5 mg total) by mouth at bedtime. 10 tablet 0  . famciclovir (FAMVIR) 500 MG tablet Take 1 tablet (500 mg total) by mouth 3 (three) times daily. 21 tablet 0  . meloxicam (MOBIC) 15 MG tablet TAKE 1 TABLET (15 MG TOTAL) BY MOUTH DAILY. 30 tablet 0  . methocarbamol (ROBAXIN) 500 MG tablet Take 1 tablet (500 mg total) by mouth 3 (three) times daily between meals as needed. 20 tablet 0  . ondansetron (ZOFRAN ODT) 8 MG disintegrating tablet Take 1 tablet (8  mg total) by mouth every 8 (eight) hours as needed for nausea or vomiting. 20 tablet 0  . pregabalin (LYRICA) 100 MG capsule Take 1 capsule (100 mg total) by mouth 2 (two) times daily. 60 capsule 0  . sertraline (ZOLOFT) 50 MG tablet Take 50 mg by mouth daily.     No current facility-administered medications on file prior to visit.     BP 135/80   Pulse 73   Temp 98.1 F (36.7 C) (Oral)   Resp 16   Ht 5\' 8"  (1.727 m)   Wt 244 lb 9.6 oz (110.9 kg)   SpO2 98%   BMI 37.19 kg/m       Objective:   Physical Exam  General Mental Status- Alert. General Appearance- Not  in acute distress.   Skin Left groin area of 2 cm circular hyperpigmentation, induration and tenderness. No fluctuance.  Neck Carotid Arteries- Normal color. Moisture- Normal Moisture. No carotid bruits. No JVD.  Chest and Lung Exam Auscultation: Breath Sounds:-Normal.  Cardiovascular Auscultation:Rythm- Regular. Murmurs & Other Heart Sounds:Auscultation of the heart reveals- No Murmurs.  Abdomen Inspection:-Inspeection Normal. Palpation/Percussion:Note:No mass. Palpation and Percussion of the abdomen reveal- Non Tender, Non Distended + BS, no rebound or guarding.    Neurologic Cranial Nerve exam:- CN III-XII intact(No nystagmus), symmetric smile. Strength:- 5/5 equal and symmetric strength both upper and lower extremities.      Assessment & Plan:  I am relieved that you are going to see a specialist this coming week for substance abuse.  For your anxiety I  am prescribing BuSpar again. Do recommend that you ask the substance abuse specialist if they will write in the future or if I will.  You do have area of cellulitis/infection in the groin area.  Prescribing doxycycline antibiotic. Remember advise regarding not to take the antibiotic on empty stomach. Avoid prolonged direct sun exposure as well while on antibiotic for area of infection. If area changes  abscess-like as described then recommend evaluation for possible incision and drainage. That is not presently.  Follow-up in 7-10 days or as needed.  Connell Bognar, Ramon DredgeEdward, PA-C

## 2016-11-05 ENCOUNTER — Ambulatory Visit: Payer: Self-pay | Admitting: Medical

## 2017-03-08 DIAGNOSIS — H40013 Open angle with borderline findings, low risk, bilateral: Secondary | ICD-10-CM | POA: Diagnosis not present

## 2017-04-13 ENCOUNTER — Ambulatory Visit: Payer: Self-pay | Admitting: Medical

## 2017-04-13 DIAGNOSIS — Z0289 Encounter for other administrative examinations: Secondary | ICD-10-CM

## 2017-04-26 DIAGNOSIS — H401131 Primary open-angle glaucoma, bilateral, mild stage: Secondary | ICD-10-CM | POA: Diagnosis not present

## 2017-05-19 DIAGNOSIS — H401131 Primary open-angle glaucoma, bilateral, mild stage: Secondary | ICD-10-CM | POA: Diagnosis not present

## 2017-07-19 ENCOUNTER — Ambulatory Visit: Payer: BLUE CROSS/BLUE SHIELD | Admitting: Medical

## 2017-07-19 DIAGNOSIS — Z0289 Encounter for other administrative examinations: Secondary | ICD-10-CM

## 2017-07-26 ENCOUNTER — Encounter: Payer: Self-pay | Admitting: Medical

## 2017-09-09 DIAGNOSIS — Z79891 Long term (current) use of opiate analgesic: Secondary | ICD-10-CM | POA: Diagnosis not present

## 2017-09-16 DIAGNOSIS — Z79891 Long term (current) use of opiate analgesic: Secondary | ICD-10-CM | POA: Diagnosis not present

## 2017-09-16 DIAGNOSIS — H401131 Primary open-angle glaucoma, bilateral, mild stage: Secondary | ICD-10-CM | POA: Diagnosis not present

## 2017-09-21 DIAGNOSIS — Z79891 Long term (current) use of opiate analgesic: Secondary | ICD-10-CM | POA: Diagnosis not present

## 2017-09-29 DIAGNOSIS — Z79891 Long term (current) use of opiate analgesic: Secondary | ICD-10-CM | POA: Diagnosis not present

## 2017-11-01 DIAGNOSIS — Z79891 Long term (current) use of opiate analgesic: Secondary | ICD-10-CM | POA: Diagnosis not present

## 2017-11-15 DIAGNOSIS — Z79891 Long term (current) use of opiate analgesic: Secondary | ICD-10-CM | POA: Diagnosis not present

## 2017-11-23 ENCOUNTER — Ambulatory Visit: Payer: BLUE CROSS/BLUE SHIELD | Admitting: Family

## 2017-11-23 ENCOUNTER — Encounter: Payer: Self-pay | Admitting: Family

## 2017-11-23 VITALS — BP 112/70 | HR 84 | Temp 98.4°F | Ht 68.0 in | Wt 261.0 lb

## 2017-11-23 DIAGNOSIS — B029 Zoster without complications: Secondary | ICD-10-CM | POA: Diagnosis not present

## 2017-11-23 MED ORDER — VALACYCLOVIR HCL 1 G PO TABS: 1000.0000 mg | ORAL_TABLET | Freq: Three times a day (TID) | ORAL | 0 refills | Status: DC

## 2017-11-23 MED ORDER — PREGABALIN 75 MG PO CAPS: 75.0000 mg | ORAL_CAPSULE | Freq: Two times a day (BID) | ORAL | 0 refills | Status: DC

## 2017-11-23 MED ORDER — PREDNISONE 10 MG (21) PO TBPK: ORAL_TABLET | ORAL | 0 refills | Status: DC

## 2017-11-23 NOTE — Progress Notes (Signed)
Kyle Frazier is a 47 y.o. male with the following history as recorded in [employer]:  There are no active problems to display for this patient.   Current Outpatient Medications  Medication Sig Dispense Refill  . Amantadine HCl 100 MG tablet Take 100 mg by mouth 2 (two) times daily.  0  . Buprenorphine HCl-Naloxone HCl 8-2 MG FILM   0  . escitalopram (LEXAPRO) 10 MG tablet TAKE 1 TABLET BY MOUTH EVERY DAY IN THE MORNING FOR 1 WEEK THEN TAKE 2 TABLETS EVERY DAY  1  . latanoprost (XALATAN) 0.005 % ophthalmic solution INSTILL 1 DROP INTO BOTH EYES EVERY EVENING  4  . predniSONE (STERAPRED UNI-PAK 21 TAB) 10 MG (21) TBPK tablet Taper as directed 21 tablet 0  . pregabalin (LYRICA) 75 MG capsule Take 1 capsule (75 mg total) by mouth 2 (two) times daily. 60 capsule 0  . valACYclovir (VALTREX) 1000 MG tablet Take 1 tablet (1,000 mg total) by mouth 3 (three) times daily. 21 tablet 0   No current facility-administered medications for this visit.     Allergies: Bee venom and Penicillins  History reviewed. No pertinent past medical history.  History reviewed. No pertinent surgical history.  History reviewed. No pertinent family history.  Social History   Tobacco Use  . Smoking status: Current Every Day Smoker    Packs/day: 2.00    Types: E-cigarettes  . Smokeless tobacco: Never Used  Substance Use Topics  . Alcohol use: No    Subjective:  Patient presents with concerns for shingles outbreak on his right chest; started last Friday with "bumps" and rash has continued to worsen over the past few days; painful to have his shirt touch his chest;    Objective:  Vitals:   11/23/17 1552  BP: 112/70  Pulse: 84  Temp: 98.4 F (36.9 C)  TempSrc: Oral  SpO2: 94%  Weight: 261 lb (118.4 kg)  Height: 5\' 8"  (1.727 m)    General: Well developed, well nourished, in no acute distress  Skin : Warm and dry. Vesicular lesions noted on right side of patient's chest- does wrap around to involve back as  well Head: Normocephalic and atraumatic  Eyes: Sclera and conjunctiva clear; pupils round and reactive to light; extraocular movements intact  Ears: External normal; canals clear; tympanic membranes normal  Oropharynx: Pink, supple. No suspicious lesions  Neck: Supple without thyromegaly, adenopathy  Lungs: Respirations unlabored;   Vessels: Symmetric bilaterally  Neurologic: Alert and oriented; speech intact; face symmetrical; moves all extremities well; CNII-XII intact without focal deficit   Assessment:  1. Herpes zoster without complication     Plan:  Rx for Valtrex 1 gm tid x 7 days; Rx for Prednisone taper pak- use as directed; samples of Lyrica 75 mg bid for nerve pain; I also encouraged patient to see his PCP in follow-up for CPE/ last seen there in 2018.  No follow-ups on file.  No orders of the defined types were placed in this encounter.   Requested Prescriptions   Signed Prescriptions Disp Refills  . valACYclovir (VALTREX) 1000 MG tablet 21 tablet 0    Sig: Take 1 tablet (1,000 mg total) by mouth 3 (three) times daily.  . predniSONE (STERAPRED UNI-PAK 21 TAB) 10 MG (21) TBPK tablet 21 tablet 0    Sig: Taper as directed  . pregabalin (LYRICA) 75 MG capsule 60 capsule 0    Sig: Take 1 capsule (75 mg total) by mouth 2 (two) times daily.

## 2017-11-23 NOTE — Patient Instructions (Signed)

## 2017-11-29 DIAGNOSIS — Z79891 Long term (current) use of opiate analgesic: Secondary | ICD-10-CM | POA: Diagnosis not present

## 2017-12-20 DIAGNOSIS — Z79891 Long term (current) use of opiate analgesic: Secondary | ICD-10-CM | POA: Diagnosis not present

## 2018-01-16 DIAGNOSIS — Z79891 Long term (current) use of opiate analgesic: Secondary | ICD-10-CM | POA: Diagnosis not present

## 2018-01-24 DIAGNOSIS — Z79891 Long term (current) use of opiate analgesic: Secondary | ICD-10-CM | POA: Diagnosis not present

## 2018-02-09 DIAGNOSIS — Z79891 Long term (current) use of opiate analgesic: Secondary | ICD-10-CM | POA: Diagnosis not present

## 2018-03-13 DIAGNOSIS — Z79891 Long term (current) use of opiate analgesic: Secondary | ICD-10-CM | POA: Diagnosis not present

## 2018-03-31 DIAGNOSIS — Z79891 Long term (current) use of opiate analgesic: Secondary | ICD-10-CM | POA: Diagnosis not present

## 2018-04-18 DIAGNOSIS — Z79891 Long term (current) use of opiate analgesic: Secondary | ICD-10-CM | POA: Diagnosis not present

## 2018-05-04 ENCOUNTER — Ambulatory Visit (INDEPENDENT_AMBULATORY_CARE_PROVIDER_SITE_OTHER): Payer: BLUE CROSS/BLUE SHIELD | Admitting: Medical

## 2018-05-04 ENCOUNTER — Telehealth: Payer: Self-pay | Admitting: Medical

## 2018-05-04 ENCOUNTER — Encounter: Payer: Self-pay | Admitting: Medical

## 2018-05-04 VITALS — BP 120/76 | HR 68 | Temp 97.9°F | Resp 16 | Ht 68.0 in | Wt 278.2 lb

## 2018-05-04 DIAGNOSIS — Z113 Encounter for screening for infections with a predominantly sexual mode of transmission: Secondary | ICD-10-CM

## 2018-05-04 DIAGNOSIS — R739 Hyperglycemia, unspecified: Secondary | ICD-10-CM | POA: Diagnosis not present

## 2018-05-04 DIAGNOSIS — R5383 Other fatigue: Secondary | ICD-10-CM | POA: Diagnosis not present

## 2018-05-04 DIAGNOSIS — Z0000 Encounter for general adult medical examination without abnormal findings: Secondary | ICD-10-CM | POA: Diagnosis not present

## 2018-05-04 NOTE — Telephone Encounter (Signed)
Pt gave urine sample last night. Will you result that.

## 2018-05-04 NOTE — Progress Notes (Signed)
Subjective:    Patient ID: Kyle Frazier, male    DOB: [birth year], 48 y.o.   MRN: [id number]  HPI Pt in for cpe.  He is fasting today.  Pt update me no longer uses any illegal drugs. 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 He is seeing [facility].  Staying clean now for months.   Pt is working for [employer], Interviewing with [employer], pt walks mile to 1.5 mile a day. Admits poor diet.   Up to date on flu vaccine.   Review of Systems  Constitutional: Positive for fatigue. Negative for chills and fever.       Some yesterday.  HENT: Negative for congestion, drooling, hearing loss, mouth sores, sinus pressure and sinus pain.   Respiratory: Negative for cough, chest tightness, shortness of breath and wheezing.   Cardiovascular: Negative for chest pain and palpitations.  Gastrointestinal: Negative for abdominal distention and anal bleeding.  Genitourinary: Negative for dysuria, enuresis, flank pain and frequency.  Musculoskeletal: Negative for back pain, myalgias and neck pain.  Skin: Negative for rash.  Neurological: Negative for dizziness, speech difficulty, weakness, numbness and headaches.  Hematological: Negative for adenopathy. Does not bruise/bleed easily.  Psychiatric/Behavioral: Negative for agitation, behavioral problems, confusion, self-injury and sleep disturbance. The patient is not nervous/anxious.     No past medical history on file.   Social History   Socioeconomic History  . Marital status: Married    Spouse name: Not on file  . Number of children: Not on file  . Years of education: Not on file  . Highest education level: Not on file  Occupational History  . Not on file  Social Needs  . Financial resource strain: Not on file  . Food insecurity:    Worry: Not on file    Inability: Not on file  . Transportation needs:    Medical: Not on file    Non-medical: Not on file  Tobacco Use  . Smoking status: Current Every Day Smoker     Packs/day: 2.00    Types: E-cigarettes  . Smokeless tobacco: Never Used  Substance and Sexual Activity  . Alcohol use: No  . Drug use: Yes    Types: Other-see comments    Comment: used some oxycotin he found  . Sexual activity: Not on file  Lifestyle  . Physical activity:    Days per week: Not on file    Minutes per session: Not on file  . Stress: Not on file  Relationships  . Social connections:    Talks on phone: Not on file    Gets together: Not on file    Attends religious service: Not on file    Active member of club or organization: Not on file    Attends meetings of clubs or organizations: Not on file    Relationship status: Not on file  . Intimate partner violence:    Fear of current or ex partner: Not on file    Emotionally abused: Not on file    Physically abused: Not on file    Forced sexual activity: Not on file  Other Topics Concern  . Not on file  Social History Narrative  . Not on file    No past surgical history on file.  No family history on file.  Allergies  Allergen Reactions  . Bee Venom Anaphylaxis  . Penicillins Swelling    Has patient had a PCN reaction causing immediate rash, facial/tongue/throat swelling, SOB or  lightheadedness with hypotension: YES Has patient had a PCN reaction causing severe rash involving mucus membranes or skin necrosis: NO Has patient had a PCN reaction that required hospitalization. NO Has patient had a PCN reaction occurring within the last 10 years: NO If all of the above answers are "NO", then may proceed with Cephalosporin use.    Current Outpatient Medications on File Prior to Visit  Medication Sig Dispense Refill  . Amantadine HCl 100 MG tablet Take 100 mg by mouth 2 (two) times daily.  0  . Buprenorphine HCl-Naloxone HCl 8-2 MG FILM   0  . escitalopram (LEXAPRO) 10 MG tablet TAKE 1 TABLET BY MOUTH EVERY DAY IN THE MORNING FOR 1 WEEK THEN TAKE 2 TABLETS EVERY DAY  1  . latanoprost (XALATAN) 0.005 %  ophthalmic solution INSTILL 1 DROP INTO BOTH EYES EVERY EVENING  4  . predniSONE (STERAPRED UNI-PAK 21 TAB) 10 MG (21) TBPK tablet Taper as directed 21 tablet 0  . pregabalin (LYRICA) 75 MG capsule Take 1 capsule (75 mg total) by mouth 2 (two) times daily. 60 capsule 0  . valACYclovir (VALTREX) 1000 MG tablet Take 1 tablet (1,000 mg total) by mouth 3 (three) times daily. 21 tablet 0   No current facility-administered medications on file prior to visit.     BP 120/76   Pulse 68   Temp 97.9 F (36.6 C) (Oral)   Resp 16   Ht 5\' 8"  (1.727 m)   Wt 278 lb 3.2 oz (126.2 kg)   SpO2 97%   BMI 42.30 kg/m       Objective:   Physical Exam  General Mental Status- Alert. General Appearance- Not in acute distress.   Skin Small scattered moles and skin tags. No worrisome lesions.  Some scattered scabs on rt rib areas.(At prior shingles site)   Neck Carotid Arteries- Normal color. Moisture- Normal Moisture. No carotid bruits. No JVD.  Chest and Lung Exam Auscultation: Breath Sounds:-Normal.  Cardiovascular Auscultation:Rythm- Regular. Murmurs & Other Heart Sounds:Auscultation of the heart reveals- No Murmurs.  Abdomen Inspection:-Inspeection Normal. Palpation/Percussion:Note:No mass. Palpation and Percussion of the abdomen reveal- Non Tender, Non Distended + BS, no rebound or guarding.   Neurologic Cranial Nerve exam:- CN III-XII intact(No nystagmus), symmetric smile. Strength:- 5/5 equal and symmetric strength both upper and lower extremities.      Assessment & Plan:  For you wellness exam today I have ordered cbc, cmp,, lipid panel, ua and hiv.  For fatigue and hx of elevated sugars will get tsh, b12, b1,  Vit d, and a1c.  Vaccine given today.   Recommend exercise and healthy diet.  We will let you know lab results as they come in.  Follow up date appointment will be determined after lab review.   Esperanza Richters, PA-C

## 2018-05-04 NOTE — Patient Instructions (Addendum)
For you wellness exam today I have ordered cbc, cmp,, lipid panel, ua and hiv.  For fatigue and hx of elevated sugars will get tsh, b12, b1,  Vit d, and a1c.  Vaccine given today.   Recommend exercise and healthy diet.  We will let you know lab results as they come in.  Follow up date appointment will be determined after lab review.    Preventive Care 40-64 Years, Male Preventive care refers to lifestyle choices and visits with your health care provider that can promote health and wellness. What does preventive care include?   A yearly physical exam. This is also called an annual well check.  Dental exams once or twice a year.  Routine eye exams. Ask your health care provider how often you should have your eyes checked.  Personal lifestyle choices, including: ? Daily care of your teeth and gums. ? Regular physical activity. ? Eating a healthy diet. ? Avoiding tobacco and drug use. ? Limiting alcohol use. ? Practicing safe sex. ? Taking low-dose aspirin every day starting at age 48. What happens during an annual well check? The services and screenings done by your health care provider during your annual well check will depend on your age, overall health, lifestyle risk factors, and family history of disease. Counseling Your health care provider may ask you questions about your:  Alcohol use.  Tobacco use.  Drug use.  Emotional well-being.  Home and relationship well-being.  Sexual activity.  Eating habits.  Work and work Statistician. Screening You may have the following tests or measurements:  Height, weight, and BMI.  Blood pressure.  Lipid and cholesterol levels. These may be checked every 5 years, or more frequently if you are over 49 years old.  Skin check.  Lung cancer screening. You may have this screening every year starting at age 13 if you have a 30-pack-year history of smoking and currently smoke or have quit within the past 15  years.  Colorectal cancer screening. All adults should have this screening starting at age 34 and continuing until age 72. Your health care provider may recommend screening at age 11. You will have tests every 1-10 years, depending on your results and the type of screening test. People at increased risk should start screening at an earlier age. Screening tests may include: ? Guaiac-based fecal occult blood testing. ? Fecal immunochemical test (FIT). ? Stool DNA test. ? Virtual colonoscopy. ? Sigmoidoscopy. During this test, a flexible tube with a tiny camera (sigmoidoscope) is used to examine your rectum and lower colon. The sigmoidoscope is inserted through your anus into your rectum and lower colon. ? Colonoscopy. During this test, a long, thin, flexible tube with a tiny camera (colonoscope) is used to examine your entire colon and rectum.  Prostate cancer screening. Recommendations will vary depending on your family history and other risks.  Hepatitis C blood test.  Hepatitis B blood test.  Sexually transmitted disease (STD) testing.  Diabetes screening. This is done by checking your blood sugar (glucose) after you have not eaten for a while (fasting). You may have this done every 1-3 years. Discuss your test results, treatment options, and if necessary, the need for more tests with your health care provider. Vaccines Your health care provider may recommend certain vaccines, such as:  Influenza vaccine. This is recommended every year.  Tetanus, diphtheria, and acellular pertussis (Tdap, Td) vaccine. You may need a Td booster every 10 years.  Varicella vaccine. You may need this if you  have not been vaccinated.  Zoster vaccine. You may need this after age 24.  Measles, mumps, and rubella (MMR) vaccine. You may need at least one dose of MMR if you were born in 1957 or later. You may also need a second dose.  Pneumococcal 13-valent conjugate (PCV13) vaccine. You may need this if you  have certain conditions and have not been vaccinated.  Pneumococcal polysaccharide (PPSV23) vaccine. You may need one or two doses if you smoke cigarettes or if you have certain conditions.  Meningococcal vaccine. You may need this if you have certain conditions.  Hepatitis A vaccine. You may need this if you have certain conditions or if you travel or work in places where you may be exposed to hepatitis A.  Hepatitis B vaccine. You may need this if you have certain conditions or if you travel or work in places where you may be exposed to hepatitis B.  Haemophilus influenzae type b (Hib) vaccine. You may need this if you have certain risk factors. Talk to your health care provider about which screenings and vaccines you need and how often you need them. This information is not intended to replace advice given to you by your health care provider. Make sure you discuss any questions you have with your health care provider. Document Released: 04/25/2015 Document Revised: 05/19/2017 Document Reviewed: 01/28/2015 Elsevier Interactive Patient Education  2019 [employer].

## 2018-05-05 LAB — POC URINALSYSI DIPSTICK (AUTOMATED)
Bilirubin, UA: NEGATIVE
Blood, UA: NEGATIVE
Glucose, UA: NEGATIVE
Ketones, UA: NEGATIVE
Leukocytes, UA: NEGATIVE
Nitrite, UA: NEGATIVE
Protein, UA: NEGATIVE
Spec Grav, UA: 1.02 (ref 1.010–1.025)
Urobilinogen, UA: NEGATIVE E.U./dL — AB
pH, UA: 6 (ref 5.0–8.0)

## 2018-05-05 LAB — CBC WITH DIFFERENTIAL/PLATELET
Basophils Absolute: 0.1 10*3/uL (ref 0.0–0.1)
Basophils Relative: 0.9 % (ref 0.0–3.0)
EOS ABS: 0.4 10*3/uL (ref 0.0–0.7)
Eosinophils Relative: 3.3 % (ref 0.0–5.0)
HCT: 45.9 % (ref 39.0–52.0)
HEMOGLOBIN: 15.6 g/dL (ref 13.0–17.0)
LYMPHS PCT: 13.3 % (ref 12.0–46.0)
Lymphs Abs: 1.6 10*3/uL (ref 0.7–4.0)
MCHC: 34.1 g/dL (ref 30.0–36.0)
MCV: 92.6 fl (ref 78.0–100.0)
MONO ABS: 1.1 10*3/uL — AB (ref 0.1–1.0)
Monocytes Relative: 9.4 % (ref 3.0–12.0)
Neutro Abs: 8.7 10*3/uL — ABNORMAL HIGH (ref 1.4–7.7)
Neutrophils Relative %: 73.1 % (ref 43.0–77.0)
Platelets: 191 10*3/uL (ref 150.0–400.0)
RBC: 4.96 Mil/uL (ref 4.22–5.81)
RDW: 13.3 % (ref 11.5–15.5)
WBC: 11.9 10*3/uL — AB (ref 4.0–10.5)

## 2018-05-05 LAB — LIPID PANEL
CHOLESTEROL: 147 mg/dL (ref 0–200)
HDL: 31 mg/dL — ABNORMAL LOW (ref >39.00)
LDL CALC: 91 mg/dL (ref 0–99)
NonHDL: 116.36
Total CHOL/HDL Ratio: 5
Triglycerides: 129 mg/dL (ref 0.0–149.0)
VLDL: 25.8 mg/dL (ref 0.0–40.0)

## 2018-05-05 LAB — COMPREHENSIVE METABOLIC PANEL
ALBUMIN: 4 g/dL (ref 3.5–5.2)
ALK PHOS: 70 U/L (ref 39–117)
ALT: 31 U/L (ref 0–53)
AST: 22 U/L (ref 0–37)
BILIRUBIN TOTAL: 0.4 mg/dL (ref 0.2–1.2)
BUN: 14 mg/dL (ref 6–23)
CO2: 29 mEq/L (ref 19–32)
CREATININE: 0.99 mg/dL (ref 0.40–1.50)
Calcium: 9.1 mg/dL (ref 8.4–10.5)
Chloride: 104 mEq/L (ref 96–112)
GFR: 80.4 mL/min (ref >60.00)
Glucose, Bld: 97 mg/dL (ref 70–99)
Potassium: 3.9 mEq/L (ref 3.5–5.1)
Sodium: 141 mEq/L (ref 135–145)
TOTAL PROTEIN: 5.9 g/dL — AB (ref 6.0–8.3)

## 2018-05-05 LAB — TSH: TSH: 1.66 u[IU]/mL (ref 0.35–4.50)

## 2018-05-05 LAB — VITAMIN B12: VITAMIN B 12: 311 pg/mL (ref 211–911)

## 2018-05-05 LAB — HEMOGLOBIN A1C: Hgb A1c MFr Bld: 5.5 % (ref 4.6–6.5)

## 2018-05-05 NOTE — Telephone Encounter (Signed)
Done

## 2018-05-05 NOTE — Addendum Note (Signed)
Addended by: Orlene Och on: 05/05/2018 08:18 AM   Modules accepted: Orders

## 2018-05-09 LAB — VITAMIN B1: Vitamin B1 (Thiamine): 8 nmol/L (ref 8–30)

## 2018-05-10 LAB — VITAMIN D 1,25 DIHYDROXY
Vitamin D 1, 25 (OH)2 Total: 47 pg/mL (ref 18–72)
Vitamin D2 1, 25 (OH)2: <8 pg/mL
Vitamin D3 1, 25 (OH)2: 47 pg/mL

## 2018-05-10 LAB — HIV ANTIBODY (ROUTINE TESTING W REFLEX): HIV 1&2 Ab, 4th Generation: NONREACTIVE

## 2018-05-18 DIAGNOSIS — Z79891 Long term (current) use of opiate analgesic: Secondary | ICD-10-CM | POA: Diagnosis not present

## 2018-05-22 DIAGNOSIS — Z79891 Long term (current) use of opiate analgesic: Secondary | ICD-10-CM | POA: Diagnosis not present

## 2018-06-02 DIAGNOSIS — Z79891 Long term (current) use of opiate analgesic: Secondary | ICD-10-CM | POA: Diagnosis not present

## 2018-06-30 ENCOUNTER — Ambulatory Visit: Payer: Self-pay

## 2018-06-30 NOTE — Telephone Encounter (Signed)
Incoming call from Patient who states that he was recently in [place].  Reports sneezing, , coughing , had a temperature last week , Denies temp currently.  Was coughing up white mucous .  Screened Patient  Via [facility] protocol.  Related to patient, Currently he is low risk,  Encouraged to monitor temperature, and /or other Sx and call back if Sx worsen.

## 2018-06-30 NOTE — Telephone Encounter (Signed)
Reason for Disposition  . [1] Caller concerned that exposure to COVID-19 occurred BUT [2] does not meet COVID-19 EXPOSURE criteria from CDC    Protocols used: CORONAVIRUS (COVID-19) EXPOSURE-A-AH
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
## 2018-10-12 DIAGNOSIS — Z79891 Long term (current) use of opiate analgesic: Secondary | ICD-10-CM | POA: Diagnosis not present

## 2019-06-26 ENCOUNTER — Ambulatory Visit (INDEPENDENT_AMBULATORY_CARE_PROVIDER_SITE_OTHER): Payer: BC Managed Care – PPO | Admitting: Medical

## 2019-06-26 ENCOUNTER — Other Ambulatory Visit: Payer: Self-pay

## 2019-06-26 VITALS — BP 118/80 | HR 67 | Temp 95.6°F | Resp 18 | Ht 68.0 in | Wt 237.0 lb

## 2019-06-26 DIAGNOSIS — F17200 Nicotine dependence, unspecified, uncomplicated: Secondary | ICD-10-CM | POA: Diagnosis not present

## 2019-06-26 DIAGNOSIS — Z0000 Encounter for general adult medical examination without abnormal findings: Secondary | ICD-10-CM | POA: Diagnosis not present

## 2019-06-26 DIAGNOSIS — Z125 Encounter for screening for malignant neoplasm of prostate: Secondary | ICD-10-CM

## 2019-06-26 DIAGNOSIS — Z1211 Encounter for screening for malignant neoplasm of colon: Secondary | ICD-10-CM | POA: Diagnosis not present

## 2019-06-26 DIAGNOSIS — R739 Hyperglycemia, unspecified: Secondary | ICD-10-CM | POA: Diagnosis not present

## 2019-06-26 DIAGNOSIS — L089 Local infection of the skin and subcutaneous tissue, unspecified: Secondary | ICD-10-CM

## 2019-06-26 MED ORDER — EPINEPHRINE 0.3 MG/0.3ML IJ SOAJ: INTRAMUSCULAR | 0 refills | Status: AC

## 2019-06-26 MED ORDER — DOXYCYCLINE HYCLATE 100 MG PO TABS: 100.0000 mg | ORAL_TABLET | Freq: Two times a day (BID) | ORAL | 0 refills | Status: DC

## 2019-06-26 NOTE — Patient Instructions (Addendum)
For you wellness exam today I have ordered cbc, cmp and  lipid panel.   For skin infection left nares region will rx doxycycline.  For family hx diabetes and elevated sugar will get a1c.  Bp good today. Recommend low salt diet and with weight loss bp will likely be controlled.  For obesity recommend weight watchers diet and exercise. Might consider saxenda after lab review.  Screening colonoscopy placed.  Recommend quit smoking.  For substance abuse issus if reoccur get back in with speciality clinic.  We will let you know lab results as they come in.  Follow up date appointment will be determined after lab review.    Preventive Care 39-69 Years Old, Male Preventive care refers to lifestyle choices and visits with your health care provider that can promote health and wellness. This includes:  A yearly physical exam. This is also called an annual well check.  Regular dental and eye exams.  Immunizations.  Screening for certain conditions.  Healthy lifestyle choices, such as eating a healthy diet, getting regular exercise, not using drugs or products that contain nicotine and tobacco, and limiting alcohol use. What can I expect for my preventive care visit? Physical exam Your health care provider will check:  Height and weight. These may be used to calculate body mass index (BMI), which is a measurement that tells if you are at a healthy weight.  Heart rate and blood pressure.  Your skin for abnormal spots. Counseling Your health care provider may ask you questions about:  Alcohol, tobacco, and drug use.  Emotional well-being.  Home and relationship well-being.  Sexual activity.  Eating habits.  Work and work Statistician. What immunizations do I need?  Influenza (flu) vaccine  This is recommended every year. Tetanus, diphtheria, and pertussis (Tdap) vaccine  You may need a Td booster every 10 years. Varicella (chickenpox) vaccine  You may need this  vaccine if you have not already been vaccinated. Zoster (shingles) vaccine  You may need this after age 55. Measles, mumps, and rubella (MMR) vaccine  You may need at least one dose of MMR if you were born in 1957 or later. You may also need a second dose. Pneumococcal conjugate (PCV13) vaccine  You may need this if you have certain conditions and were not previously vaccinated. Pneumococcal polysaccharide (PPSV23) vaccine  You may need one or two doses if you smoke cigarettes or if you have certain conditions. Meningococcal conjugate (MenACWY) vaccine  You may need this if you have certain conditions. Hepatitis A vaccine  You may need this if you have certain conditions or if you travel or work in places where you may be exposed to hepatitis A. Hepatitis B vaccine  You may need this if you have certain conditions or if you travel or work in places where you may be exposed to hepatitis B. Haemophilus influenzae type b (Hib) vaccine  You may need this if you have certain risk factors. Human papillomavirus (HPV) vaccine  If recommended by your health care provider, you may need three doses over 6 months. You may receive vaccines as individual doses or as more than one vaccine together in one shot (combination vaccines). Talk with your health care provider about the risks and benefits of combination vaccines. What tests do I need? Blood tests  Lipid and cholesterol levels. These may be checked every 5 years, or more frequently if you are over 41 years old.  Hepatitis C test.  Hepatitis B test. Screening  Lung cancer  screening. You may have this screening every year starting at age 26 if you have a 30-pack-year history of smoking and currently smoke or have quit within the past 15 years.  Prostate cancer screening. Recommendations will vary depending on your family history and other risks.  Colorectal cancer screening. All adults should have this screening starting at age 79  and continuing until age 36. Your health care provider may recommend screening at age 74 if you are at increased risk. You will have tests every 1-10 years, depending on your results and the type of screening test.  Diabetes screening. This is done by checking your blood sugar (glucose) after you have not eaten for a while (fasting). You may have this done every 1-3 years.  Sexually transmitted disease (STD) testing. Follow these instructions at home: Eating and drinking  Eat a diet that includes fresh fruits and vegetables, whole grains, lean protein, and low-fat dairy products.  Take vitamin and mineral supplements as recommended by your health care provider.  Do not drink alcohol if your health care provider tells you not to drink.  If you drink alcohol: ? Limit how much you have to 0-2 drinks a day. ? Be aware of how much alcohol is in your drink. In the [place] one drink equals one 12 oz bottle of beer (355 mL), one 5 oz glass of wine (148 mL), or one 1 oz glass of hard liquor (44 mL). Lifestyle  Take daily care of your teeth and gums.  Stay active. Exercise for at least 30 minutes on 5 or more days each week.  Do not use any products that contain nicotine or tobacco, such as cigarettes, e-cigarettes, and chewing tobacco. If you need help quitting, ask your health care provider.  If you are sexually active, practice safe sex. Use a condom or other form of protection to prevent STIs (sexually transmitted infections).  Talk with your health care provider about taking a low-dose aspirin every day starting at age 70. What's next?  Go to your health care provider once a year for a well check visit.  Ask your health care provider how often you should have your eyes and teeth checked.  Stay up to date on all vaccines. This information is not intended to replace advice given to you by your health care provider. Make sure you discuss any questions you have with your health care  provider. Document Revised: 03/23/2018 Document Reviewed: 03/23/2018 Elsevier Patient Education  2020 [employer].

## 2019-06-26 NOTE — Progress Notes (Signed)
   Subjective:    Patient ID: Kyle Frazier, male    DOB: 1970/11/05, 49 y.o.   MRN: [id number]  HPI   Pt in for cpe/welnesss.   Pt has not eaten since 10 am.  Pt has been working/carpet cleaning. Has not been exercising. Purposeful weight loss by eating about half as much as he used to. He drinks about 2 pots of coffee a day. Smoking cigarettes pack and half to 2 packs a day. No alcohol.  Pt has history of substance abuse. Had issues with heroin. Pt has seen specialist who had rx'd buprenorphine hcl-nalaxone. When used medication it was helpful.  Pt has left nares redness and tenderness for about a month.  Pt bp 3 weeks ago was 150/90. He was slight dizzy. For about one week bp was like this then came back down. Now not dizzy and bp today 118/80.   FH of diabetes and 3 years ago sugar 135 on exam.   Pt wife wants to know if he might benefit from saxenda.   Pt needs colonoscopy.    Review of Systems     Objective:   Physical Exam   General Mental Status- Alert. General Appearance- Not in acute distress.   Skin General: Color- Normal Color. Moisture- Normal Moisture.  Below left nares- red area that is mild tender.  Neck Carotid Arteries- Normal color. Moisture- Normal Moisture. No carotid bruits. No JVD.  Chest and Lung Exam Auscultation: Breath Sounds:-Normal.  Cardiovascular Auscultation:Rythm- Regular. Murmurs & Other Heart Sounds:Auscultation of the heart reveals- No Murmurs.  Abdomen Inspection:-Inspeection Normal. Palpation/Percussion:Note:No mass. Palpation and Percussion of the abdomen reveal- Non Tender, Non Distended + BS, no rebound or guarding.    Neurologic Cranial Nerve exam:- CN III-XII intact(No nystagmus), symmetric smile. Strength:- 5/5 equal and symmetric strength both upper and lower extremities.     Assessment & Plan:  For you wellness exam today I have ordered cbc, cmp and  lipid panel.    For skin infection left nares region will  rx doxycycline.  For family hx diabetes and elevated sugar will get a1c.  BP good today. Recommend low salt diet and with weight loss bp will likely be controlled.  For obesity recommend weight watchers diet and exercise. Might consider saxenda after lab review.  Screening colonoscopy placed.  Recommend quit smoking.  For substance abuse issus if reoccur get back in with speciality clinic.  We will let you know lab results as they come in.  Follow up date appointment will be determined after lab review.   [id number] additonal minutes spent on top of wellness exam today. Addressed skin infection, elevated sugar hx, obesity and elevated bp.  Esperanza Richters, PA-C

## 2019-06-27 ENCOUNTER — Encounter: Payer: Self-pay | Admitting: Medical

## 2019-06-27 LAB — COMPREHENSIVE METABOLIC PANEL
ALT: 19 U/L (ref 0–53)
AST: 16 U/L (ref 0–37)
Albumin: 3.9 g/dL (ref 3.5–5.2)
Alkaline Phosphatase: 91 U/L (ref 39–117)
BUN: 17 mg/dL (ref 6–23)
CO2: 29 mEq/L (ref 19–32)
Calcium: 8.8 mg/dL (ref 8.4–10.5)
Chloride: 104 mEq/L (ref 96–112)
Creatinine, Ser: 0.88 mg/dL (ref 0.40–1.50)
GFR: 91.67 mL/min (ref >60.00)
Glucose, Bld: 85 mg/dL (ref 70–99)
Potassium: 4.2 mEq/L (ref 3.5–5.1)
Sodium: 139 mEq/L (ref 135–145)
Total Bilirubin: 0.3 mg/dL (ref 0.2–1.2)
Total Protein: 6 g/dL (ref 6.0–8.3)

## 2019-06-27 LAB — CBC WITH DIFFERENTIAL/PLATELET
Basophils Absolute: 0.1 10*3/uL (ref 0.0–0.1)
Basophils Relative: 1.2 % (ref 0.0–3.0)
Eosinophils Absolute: 0.5 10*3/uL (ref 0.0–0.7)
Eosinophils Relative: 4.8 % (ref 0.0–5.0)
HCT: 42.4 % (ref 39.0–52.0)
Hemoglobin: 14.8 g/dL (ref 13.0–17.0)
Lymphocytes Relative: 21.5 % (ref 12.0–46.0)
Lymphs Abs: 2.1 10*3/uL (ref 0.7–4.0)
MCHC: 34.9 g/dL (ref 30.0–36.0)
MCV: 91.7 fl (ref 78.0–100.0)
Monocytes Absolute: 1 10*3/uL (ref 0.1–1.0)
Monocytes Relative: 10.2 % (ref 3.0–12.0)
Neutro Abs: 6.1 10*3/uL (ref 1.4–7.7)
Neutrophils Relative %: 62.3 % (ref 43.0–77.0)
Platelets: 164 10*3/uL (ref 150.0–400.0)
RBC: 4.62 Mil/uL (ref 4.22–5.81)
RDW: 13.5 % (ref 11.5–15.5)
WBC: 9.8 10*3/uL (ref 4.0–10.5)

## 2019-06-27 LAB — PSA: PSA: 1.09 ng/mL (ref 0.10–4.00)

## 2019-06-27 LAB — LIPID PANEL
Cholesterol: 107 mg/dL (ref 0–200)
HDL: 33.3 mg/dL — ABNORMAL LOW (ref >39.00)
LDL Cholesterol: 65 mg/dL (ref 0–99)
NonHDL: 73.48
Total CHOL/HDL Ratio: 3
Triglycerides: 44 mg/dL (ref 0.0–149.0)
VLDL: 8.8 mg/dL (ref 0.0–40.0)

## 2019-06-27 LAB — HEMOGLOBIN A1C: Hgb A1c MFr Bld: 5.4 % (ref 4.6–6.5)

## 2019-06-28 ENCOUNTER — Ambulatory Visit: Payer: BC Managed Care – PPO | Admitting: Medical

## 2019-06-28 ENCOUNTER — Other Ambulatory Visit: Payer: Self-pay

## 2019-06-29 ENCOUNTER — Other Ambulatory Visit: Payer: Self-pay

## 2019-06-29 ENCOUNTER — Ambulatory Visit: Payer: BC Managed Care – PPO | Admitting: Medical

## 2019-06-29 VITALS — BP 119/60 | HR 64 | Temp 96.2°F | Resp 18 | Ht 68.0 in | Wt 241.0 lb

## 2019-06-29 DIAGNOSIS — M5431 Sciatica, right side: Secondary | ICD-10-CM | POA: Diagnosis not present

## 2019-06-29 MED ORDER — KETOROLAC TROMETHAMINE 60 MG/2ML IM SOLN
60.0000 mg | Freq: Once | INTRAMUSCULAR | Status: AC
  Administered 2019-06-29: 60 mg via INTRAMUSCULAR

## 2019-06-29 MED ORDER — DICLOFENAC SODIUM 75 MG PO TBEC: 75.0000 mg | DELAYED_RELEASE_TABLET | Freq: Two times a day (BID) | ORAL | 0 refills | Status: DC

## 2019-06-29 MED ORDER — MUPIROCIN 2 % EX OINT: TOPICAL_OINTMENT | CUTANEOUS | 0 refills | Status: DC

## 2019-06-29 MED ORDER — CYCLOBENZAPRINE HCL 5 MG PO TABS: 5.0000 mg | ORAL_TABLET | Freq: Every day | ORAL | 0 refills | Status: DC

## 2019-06-29 NOTE — Addendum Note (Signed)
Addended by: Maximino Sarin on: 06/29/2019 11:30 AM   Modules accepted: Orders

## 2019-06-29 NOTE — Progress Notes (Signed)
Subjective:    Patient ID: Kyle Frazier, male    DOB: 1971-01-04, 48 y.o.   MRN: [id number]  HPI  Pt in for back pain. He points to rt lower back. No trauma or fall.   Pt has tried heating pad and ibuprofen. Last time took ibuprofen was this morning.  No pain in leg. No radicular pain.  No rash to skin. No hx of pain in this area.   Pain level is 7/10.    Review of Systems  Constitutional: Negative for chills, fatigue and fever.  Respiratory: Negative for cough, chest tightness and wheezing.   Cardiovascular: Negative for chest pain and palpitations.  Gastrointestinal: Negative for abdominal pain, constipation and diarrhea.  Musculoskeletal: Negative for back pain, myalgias and neck stiffness.       Rt side sciatica pain  Hematological: Negative for adenopathy.  Psychiatric/Behavioral: Negative for behavioral problems, confusion, hallucinations and self-injury. The patient is not nervous/anxious.     No past medical history on file.   Social History   Socioeconomic History  . Marital status: Married    Spouse name: Not on file  . Number of children: Not on file  . Years of education: Not on file  . Highest education level: Not on file  Occupational History  . Not on file  Tobacco Use  . Smoking status: Current Every Day Smoker    Packs/day: 2.00    Types: E-cigarettes  . Smokeless tobacco: Never Used  Substance and Sexual Activity  . Alcohol use: No  . Drug use: Yes    Types: Other-see comments    Comment: used some oxycotin he found  . Sexual activity: Not on file  Other Topics Concern  . Not on file  Social History Narrative  . Not on file   Social Determinants of Health   Financial Resource Strain:   . Difficulty of Paying Living Expenses:   Food Insecurity:   . Worried About Programme researcher, broadcasting/film/video in the Last Year:   . Barista in the Last Year:   Transportation Needs:   . Freight forwarder (Medical):   Marland Kitchen Lack of Transportation  (Non-Medical):   Physical Activity:   . Days of Exercise per Week:   . Minutes of Exercise per Session:   Stress:   . Feeling of Stress :   Social Connections:   . Frequency of Communication with Friends and Family:   . Frequency of Social Gatherings with Friends and Family:   . Attends Religious Services:   . Active Member of Clubs or Organizations:   . Attends Banker Meetings:   Marland Kitchen Marital Status:   Intimate Partner Violence:   . Fear of Current or Ex-Partner:   . Emotionally Abused:   Marland Kitchen Physically Abused:   . Sexually Abused:     No past surgical history on file.  No family history on file.  Allergies  Allergen Reactions  . Bee Venom Anaphylaxis  . Penicillins Swelling    Has patient had a PCN reaction causing immediate rash, facial/tongue/throat swelling, SOB or lightheadedness with hypotension: YES Has patient had a PCN reaction causing severe rash involving mucus membranes or skin necrosis: NO Has patient had a PCN reaction that required hospitalization. NO Has patient had a PCN reaction occurring within the last 10 years: NO If all of the above answers are "NO", then may proceed with Cephalosporin use.    Current Outpatient Medications on File Prior to Visit  Medication Sig Dispense Refill  . Amantadine HCl 100 MG tablet Take 100 mg by mouth 2 (two) times daily.  0  . Buprenorphine HCl-Naloxone HCl 8-2 MG FILM   0  . doxycycline (VIBRA-TABS) 100 MG tablet Take 1 tablet (100 mg total) by mouth 2 (two) times daily. 14 tablet 0  . EPINEPHrine 0.3 mg/0.3 mL IJ SOAJ injection IM injection as needed anaphylactic reaction 2 each 0  . escitalopram (LEXAPRO) 10 MG tablet TAKE 1 TABLET BY MOUTH EVERY DAY IN THE MORNING FOR 1 WEEK THEN TAKE 2 TABLETS EVERY DAY  1  . latanoprost (XALATAN) 0.005 % ophthalmic solution INSTILL 1 DROP INTO BOTH EYES EVERY EVENING  4  . predniSONE (STERAPRED UNI-PAK 21 TAB) 10 MG (21) TBPK tablet Taper as directed (Patient not taking:  Reported on 06/26/2019) 21 tablet 0  . pregabalin (LYRICA) 75 MG capsule Take 1 capsule (75 mg total) by mouth 2 (two) times daily. (Patient not taking: Reported on 06/26/2019) 60 capsule 0  . valACYclovir (VALTREX) 1000 MG tablet Take 1 tablet (1,000 mg total) by mouth 3 (three) times daily. (Patient not taking: Reported on 06/26/2019) 21 tablet 0   No current facility-administered medications on file prior to visit.    BP 119/60 (BP Location: Left Arm, Patient Position: Sitting, Cuff Size: Large)   Pulse 64   Temp (!) 96.2 F (35.7 C) (Temporal)   Resp 18   Ht 5\' 8"  (1.727 m)   Wt 241 lb (109.3 kg)   SpO2 93%   BMI 36.64 kg/m       Objective:   Physical Exam  General Appearance- Not in acute distress.    Chest and Lung Exam Auscultation: Breath sounds:-Normal. Clear even and unlabored. Adventitious sounds:- No Adventitious sounds.  Cardiovascular Auscultation:Rythm - Regular, rate and rythm. Heart Sounds -Normal heart sounds.  Abdomen Inspection:-Inspection Normal.  Palpation/Perucssion: Palpation and Percussion of the abdomen reveal- Non Tender, No Rebound tenderness, No rigidity(Guarding) and No Palpable abdominal masses.  Liver:-Normal.  Spleen:- Normal.   Back No Mid lumbar spine tenderness to palpation. Rt side si tenderness Pain on straight leg lift. Pain on lateral movements and flexion/extension of the spine.  Lower ext neurologic  L5-S1 sensation intact bilaterally. Normal patellar reflexes bilaterally. No foot drop bilaterally.      Assessment & Plan:  For sciatica pain, we gave toradol 60 mg im today. Then starting tomorrow use diclofenac(dc ibuprofen). Also rx low dose flexeril 5 mg to use at night. Rx advisement on meds given.  Back stretching exercises as tolerated.  If signs/symptoms persist or worsen consider PT or sports medicine.  Follow up 7-10 days or as needed  [employer]

## 2019-06-29 NOTE — Patient Instructions (Addendum)
For sciatica pain, we gave toradol 60 mg im today. Then starting tomorrow use diclofenac(dc ibuprofen). Also rx low dose flexeril 5 mg to use at night. Rx advisement on meds given.  Back stretching exercises as tolerated.  If signs/symptoms persist or worsen consider PT or sports medicine.  Follow up 7-10 days or as needed   Back Exercises These exercises help to make your trunk and back strong. They also help to keep the lower back flexible. Doing these exercises can help to prevent back pain or lessen existing pain.  If you have back pain, try to do these exercises 2-3 times each day or as told by your doctor.  As you get better, do the exercises once each day. Repeat the exercises more often as told by your doctor.  To stop back pain from coming back, do the exercises once each day, or as told by your doctor. Exercises Single knee to chest Do these steps 3-5 times in a row for each leg: 1. Lie on your back on a firm bed or the floor with your legs stretched out. 2. Bring one knee to your chest. 3. Grab your knee or thigh with both hands and hold them it in place. 4. Pull on your knee until you feel a gentle stretch in your lower back or buttocks. 5. Keep doing the stretch for 10-30 seconds. 6. Slowly let go of your leg and straighten it. Pelvic tilt Do these steps 5-10 times in a row: 1. Lie on your back on a firm bed or the floor with your legs stretched out. 2. Bend your knees so they point up to the ceiling. Your feet should be flat on the floor. 3. Tighten your lower belly (abdomen) muscles to press your lower back against the floor. This will make your tailbone point up to the ceiling instead of pointing down to your feet or the floor. 4. Stay in this position for 5-10 seconds while you gently tighten your muscles and breathe evenly. Cat-cow Do these steps until your lower back bends more easily: 1. Get on your hands and knees on a firm surface. Keep your hands under your  shoulders, and keep your knees under your hips. You may put padding under your knees. 2. Let your head hang down toward your chest. Tighten (contract) the muscles in your belly. Point your tailbone toward the floor so your lower back becomes rounded like the back of a cat. 3. Stay in this position for 5 seconds. 4. Slowly lift your head. Let the muscles of your belly relax. Point your tailbone up toward the ceiling so your back forms a sagging arch like the back of a cow. 5. Stay in this position for 5 seconds.  Press-ups Do these steps 5-10 times in a row: 1. Lie on your belly (face-down) on the floor. 2. Place your hands near your head, about shoulder-width apart. 3. While you keep your back relaxed and keep your hips on the floor, slowly straighten your arms to raise the top half of your body and lift your shoulders. Do not use your back muscles. You may change where you place your hands in order to make yourself more comfortable. 4. Stay in this position for 5 seconds. 5. Slowly return to lying flat on the floor.  Bridges Do these steps 10 times in a row: 1. Lie on your back on a firm surface. 2. Bend your knees so they point up to the ceiling. Your feet should be flat  on the floor. Your arms should be flat at your sides, next to your body. 3. Tighten your butt muscles and lift your butt off the floor until your waist is almost as high as your knees. If you do not feel the muscles working in your butt and the back of your thighs, slide your feet 1-2 inches farther away from your butt. 4. Stay in this position for 3-5 seconds. 5. Slowly lower your butt to the floor, and let your butt muscles relax. If this exercise is too easy, try doing it with your arms crossed over your chest. Belly crunches Do these steps 5-10 times in a row: 1. Lie on your back on a firm bed or the floor with your legs stretched out. 2. Bend your knees so they point up to the ceiling. Your feet should be flat on the  floor. 3. Cross your arms over your chest. 4. Tip your chin a little bit toward your chest but do not bend your neck. 5. Tighten your belly muscles and slowly raise your chest just enough to lift your shoulder blades a tiny bit off of the floor. Avoid raising your body higher than that, because it can put too much stress on your low back. 6. Slowly lower your chest and your head to the floor. Back lifts Do these steps 5-10 times in a row: 1. Lie on your belly (face-down) with your arms at your sides, and rest your forehead on the floor. 2. Tighten the muscles in your legs and your butt. 3. Slowly lift your chest off of the floor while you keep your hips on the floor. Keep the back of your head in line with the curve in your back. Look at the floor while you do this. 4. Stay in this position for 3-5 seconds. 5. Slowly lower your chest and your face to the floor. Contact a doctor if:  Your back pain gets a lot worse when you do an exercise.  Your back pain does not get better 2 hours after you exercise. If you have any of these problems, stop doing the exercises. Do not do them again unless your doctor says it is okay. Get help right away if:  You have sudden, very bad back pain. If this happens, stop doing the exercises. Do not do them again unless your doctor says it is okay. This information is not intended to replace advice given to you by your health care provider. Make sure you discuss any questions you have with your health care provider. Document Revised: 12/22/2017 Document Reviewed: 12/22/2017 Elsevier Patient Education  2020 [employer].

## 2019-07-06 ENCOUNTER — Other Ambulatory Visit: Payer: Self-pay

## 2019-07-06 ENCOUNTER — Ambulatory Visit (INDEPENDENT_AMBULATORY_CARE_PROVIDER_SITE_OTHER): Payer: BC Managed Care – PPO | Admitting: Medical

## 2019-07-06 ENCOUNTER — Ambulatory Visit (HOSPITAL_BASED_OUTPATIENT_CLINIC_OR_DEPARTMENT_OTHER)
Admission: RE | Admit: 2019-07-06 | Discharge: 2019-07-06 | Disposition: A | Discharge status: Home / Self Care | Payer: BC Managed Care – PPO | Source: Ambulatory Visit | Attending: Medical | Admitting: Medical

## 2019-07-06 DIAGNOSIS — M79609 Pain in unspecified limb: Secondary | ICD-10-CM | POA: Insufficient documentation

## 2019-07-06 DIAGNOSIS — M79662 Pain in left lower leg: Secondary | ICD-10-CM

## 2019-07-06 DIAGNOSIS — M5431 Sciatica, right side: Secondary | ICD-10-CM

## 2019-07-06 DIAGNOSIS — L089 Local infection of the skin and subcutaneous tissue, unspecified: Secondary | ICD-10-CM

## 2019-07-06 NOTE — Progress Notes (Addendum)
   Subjective:    Patient ID: Kyle Frazier, male    DOB: September 24, 1970, 49 y.o.   MRN: [id number]  HPI  Virtual Visit via Telephone Note  I connected with Kyle Frazier on 07/06/19 at  2:00 PM EDT by telephone and verified that I am speaking with the correct person using two identifiers.  Location: Patient: home Provider: office  No vitals today.   I discussed the limitations, risks, security and privacy concerns of performing an evaluation and management service by telephone and the availability of in person appointments. I also discussed with the patient that there may be a patient responsible charge related to this service. The patient expressed understanding and agreed to proceed.   History of Present Illness: Pt states his back does feel better/alot better. States middle of back is better and no pain radiating to rt leg/sciatic area any more.   But Tuesday night he woke up and his left calf was sore. He states also had some pain in back of the knee. No calf swelling. Pt states calf is still tender. No sob, no wheezing and no calf swelling.   Pt left nares/upper lip area seems healing with antibiotic. He state with antibiotic skin dried up and peeled al ittle bit. Now he states looks like new skin present.     Observations/Objective:  General- no acute distress, pleasant, alert and orienteed. Normal speech.(on phon)/ Brief video exam with no audio showed his skin beneath left nares looked better. Not red and no hyperpitmentation.   Assessment and Plan: Glad to hear that you are lower back/sciatica type pain have resolved.  You have recent new left calf and left popliteal pain.  There is pain that has mostly improved from high level pain to residual presently.  Do think is best to go ahead and get left lower extremity ultrasound today to rule out a DVT.  We will see if you have her Baker's cyst as well.  If no findings on ultrasound treat like calf strain.  It appears that your skin  infection area beneath left nares cleared up with oral antibiotics and mupirocin.  So probably had some MRSA present.  Continue topical antibiotic for the next 7 days.  Follow-up date to be determined after ultrasound review.  Esperanza Richters, PA-C  Follow Up Instructions:    I discussed the assessment and treatment plan with the patient. The patient was provided an opportunity to ask questions and all were answered. The patient agreed with the plan and demonstrated an understanding of the instructions.   The patient was advised to call back or seek an in-person evaluation if the symptoms worsen or if the condition fails to improve as anticipated.  I provided 25 minutes of non-face-to-face time during this encounter.   Esperanza Richters, PA-C   Review of Systems     Objective:   Physical Exam        Assessment & Plan:

## 2019-07-06 NOTE — Patient Instructions (Signed)
Glad to hear that you are lower back/sciatica type pain have resolved.  You have recent new left calf and left popliteal pain.  There is pain that has mostly improved from high level pain to residual presently.  Do think is best to go ahead and get left lower extremity ultrasound today to rule out a DVT.  We will see if you have her Baker's cyst as well.  If no findings on ultrasound treat like calf strain.  It appears that your skin infection area beneath left nares cleared up with oral antibiotics and mupirocin.  So probably had some MRSA present.  Continue topical antibiotic for the next 7 days.  Follow-up date to be determined after ultrasound review.

## 2019-07-11 ENCOUNTER — Encounter: Payer: Self-pay | Admitting: Medical

## 2019-07-12 MED ORDER — MUPIROCIN 2 % EX OINT: TOPICAL_OINTMENT | CUTANEOUS | 0 refills | Status: DC

## 2019-07-18 ENCOUNTER — Encounter: Payer: Self-pay | Admitting: Medical

## 2019-07-30 DIAGNOSIS — H40013 Open angle with borderline findings, low risk, bilateral: Secondary | ICD-10-CM | POA: Diagnosis not present

## 2019-08-02 ENCOUNTER — Other Ambulatory Visit: Payer: Self-pay

## 2019-08-02 ENCOUNTER — Encounter: Payer: Self-pay | Admitting: Family Medicine

## 2019-08-02 ENCOUNTER — Ambulatory Visit: Payer: BC Managed Care – PPO | Admitting: Family Medicine

## 2019-08-02 VITALS — BP 140/90 | HR 82 | Temp 96.9°F | Resp 18 | Ht 68.0 in | Wt 233.0 lb

## 2019-08-02 DIAGNOSIS — L089 Local infection of the skin and subcutaneous tissue, unspecified: Secondary | ICD-10-CM

## 2019-08-02 MED ORDER — DOXYCYCLINE HYCLATE 100 MG PO TABS: 100.0000 mg | ORAL_TABLET | Freq: Two times a day (BID) | ORAL | 0 refills | Status: DC

## 2019-08-02 MED ORDER — MUPIROCIN 2 % EX OINT: TOPICAL_OINTMENT | CUTANEOUS | 0 refills | Status: DC

## 2019-08-02 NOTE — Progress Notes (Signed)
[facility] [address], [address] [place], [place] [address] [phone]  Date:  08/02/2019   Name:  Kyle Frazier   DOB:  06-12-1970   MRN:  [id number]  PCP:  Kyle Richters, PA-C    Chief Complaint: Rash (under nose, one month)   History of Present Illness:  Kyle Frazier is a 49 y.o. very pleasant male patient who presents with the following:  Gentleman who typically sees Kyle Frazier is seen today in clinic with concern of a skin issue on his left nare and upper lip He is a smoker, I have not seen him previously  He was here on 3/16 and saw Kyle Frazier for this same issue- he was treated with doxycycline and then mupirocin was added 3 days later.  The infection seemed to respond well but then returned within 2 days of completing his doxycycline  No other skin infection or problem noted  He is working in a dusty environment and wonders if this might be to blame   No fever noted  He otherwise feels well  He recently started a new job and hated to call out today but he was worried  30+ pack year history of smoking  There are no problems to display for this patient.   History reviewed. No pertinent past medical history.  History reviewed. No pertinent surgical history.  Social History   Tobacco Use  . Smoking status: Current Every Day Smoker    Packs/day: 2.00    Types: E-cigarettes  . Smokeless tobacco: Never Used  Substance Use Topics  . Alcohol use: No  . Drug use: Yes    Types: Other-see comments    Comment: used some oxycotin he found    History reviewed. No pertinent family history.  Allergies  Allergen Reactions  . Bee Venom Anaphylaxis  . Penicillins Swelling    Has patient had a PCN reaction causing immediate rash, facial/tongue/throat swelling, SOB or lightheadedness with hypotension: YES Has patient had a PCN reaction causing severe rash involving mucus membranes or skin necrosis: NO Has  patient had a PCN reaction that required hospitalization. NO Has patient had a PCN reaction occurring within the last 10 years: NO If all of the above answers are "NO", then may proceed with Cephalosporin use.    Medication list has been reviewed and updated.  Current Outpatient Medications on File Prior to Visit  Medication Sig Dispense Refill  . Amantadine HCl 100 MG tablet Take 100 mg by mouth 2 (two) times daily.  0  . Buprenorphine HCl-Naloxone HCl 8-2 MG FILM   0  . cyclobenzaprine (FLEXERIL) 5 MG tablet Take 1 tablet (5 mg total) by mouth at bedtime. 7 tablet 0  . diclofenac (VOLTAREN) 75 MG EC tablet Take 1 tablet (75 mg total) by mouth 2 (two) times daily. 20 tablet 0  . EPINEPHrine 0.3 mg/0.3 mL IJ SOAJ injection IM injection as needed anaphylactic reaction 2 each 0  . escitalopram (LEXAPRO) 10 MG tablet TAKE 1 TABLET BY MOUTH EVERY DAY IN THE MORNING FOR 1 WEEK THEN TAKE 2 TABLETS EVERY DAY  1  . latanoprost (XALATAN) 0.005 % ophthalmic solution INSTILL 1 DROP INTO BOTH EYES EVERY EVENING  4  . predniSONE (STERAPRED UNI-PAK 21 TAB) 10 MG (21) TBPK tablet Taper as directed 21 tablet 0  . pregabalin (LYRICA) 75 MG capsule Take 1 capsule (75 mg total) by mouth 2 (two) times daily. 60 capsule  0  . valACYclovir (VALTREX) 1000 MG tablet Take 1 tablet (1,000 mg total) by mouth 3 (three) times daily. 21 tablet 0   No current facility-administered medications on file prior to visit.    Review of Systems:  As per HPI- otherwise negative.   Physical Examination: Vitals:   08/02/19 1329 08/02/19 1353  BP: (!) 145/90 140/90  Pulse: 82   Resp: 18   Temp: (!) 96.9 F (36.1 C)   SpO2: 96%    Vitals:   08/02/19 1329  Weight: 233 lb (105.7 kg)  Height: 5\' 8"  (1.727 m)   Body mass index is 35.43 kg/m. Ideal Body Weight: Weight in (lb) to have BMI = 25: 164.1  GEN: no acute distress.  Overweight, smoker.  Otherwise looks well HEENT: Atraumatic, Normocephalic.  Ears and Nose:  No external deformity. CV: RRR, No M/G/R. No JVD. No thrill. No extra heart sounds. PULM: CTA B, no wheezes, crackles, rhonchi. No retractions. No resp. distress. No accessory muscle use. EXTR: No c/c/e PSYCH: Normally interactive. Conversant.  He has what looks like superficial cellulitis at the left external nare and adjacent upper lip.  Does not extend to the vermilion border.  Oral cavity and tongue appear normal except for poor dentition  BP Readings from Last 3 Encounters:  08/02/19 140/90  06/29/19 119/60  06/26/19 118/80    Assessment and Plan: Skin infection - Plan: doxycycline (VIBRA-TABS) 100 MG tablet, mupirocin ointment (BACTROBAN) 2 %  Pt here today with likely recurrent cellulitis of lip.  He did well with oral doxy but sx returned once he completed the course.  Will treat again with a longer course of doxy and mupirocin as needed He is cautioned to alert me if his skin issue returns again- he is a set up for oral cancers with his smoking history and will need derm referral if recurrent   Moderate med decision making today  This visit occurred during the SARS-CoV-2 public health emergency.  Safety protocols were in place, including screening questions prior to the visit, additional usage of staff PPE, and extensive cleaning of exam room while observing appropriate contact time as indicated for disinfecting solutions.    Signed Lamar Blinks, MD

## 2019-08-02 NOTE — Patient Instructions (Addendum)
It was nice to see you today- please let me know if your skin infection returns after treatment again.  In that case I would like to have to see dermatology  Please let me know also if your skin infection does not respond to treatment or starts getting worse    Please work on quitting smoking if you are able.  Also, if you can please seek routine dental care for the long term health of your teeth  Take care!

## 2019-08-21 ENCOUNTER — Other Ambulatory Visit: Payer: Self-pay

## 2019-08-21 ENCOUNTER — Ambulatory Visit: Payer: BC Managed Care – PPO | Admitting: Medical

## 2019-08-21 ENCOUNTER — Encounter: Payer: Self-pay | Admitting: Medical

## 2019-08-21 VITALS — BP 150/91 | HR 85 | Temp 98.2°F | Resp 18 | Ht 68.0 in | Wt 227.0 lb

## 2019-08-21 DIAGNOSIS — L089 Local infection of the skin and subcutaneous tissue, unspecified: Secondary | ICD-10-CM

## 2019-08-21 MED ORDER — CLINDAMYCIN HCL 150 MG PO CAPS: 150.0000 mg | ORAL_CAPSULE | Freq: Three times a day (TID) | ORAL | 0 refills | Status: DC

## 2019-08-21 NOTE — Addendum Note (Signed)
Addended by: Mervin Kung A on: 08/21/2019 12:09 PM   Modules accepted: Orders

## 2019-08-21 NOTE — Progress Notes (Signed)
Subjective:    Patient ID: Kyle Frazier, male    DOB: 1971-03-03, 49 y.o.   MRN: [id number]  HPI  Pt in for 2 month of left nares and upper lip redness and tenderness.   Pt had good response to 1st course of doxycycline and mupirocin. But after 2 days of finishing course he had reoccurrence. The saw Dr. Lorelei Pont who gave 3 week course of doxycycline and mupirocin. The area is not getting better. About the same.  No fever, no chills or sweats.     Review of Systems  Constitutional: Positive for fatigue. Negative for chills and fever.  HENT:       See hpi.  Respiratory: Negative for cough, chest tightness, shortness of breath and wheezing.   Cardiovascular: Negative for chest pain and palpitations.  Musculoskeletal: Negative for back pain.  Skin:       See hpi.  Hematological: Negative for adenopathy. Does not bruise/bleed easily.  Psychiatric/Behavioral: Negative for behavioral problems and confusion.   No past medical history on file.   Social History   Socioeconomic History  . Marital status: Married    Spouse name: Not on file  . Number of children: Not on file  . Years of education: Not on file  . Highest education level: Not on file  Occupational History  . Not on file  Tobacco Use  . Smoking status: Current Every Day Smoker    Packs/day: 2.00    Types: E-cigarettes  . Smokeless tobacco: Never Used  Substance and Sexual Activity  . Alcohol use: No  . Drug use: Yes    Types: Other-see comments    Comment: used some oxycotin he found  . Sexual activity: Not on file  Other Topics Concern  . Not on file  Social History Narrative  . Not on file   Social Determinants of Health   Financial Resource Strain:   . Difficulty of Paying Living Expenses:   Food Insecurity:   . Worried About Charity fundraiser in the Last Year:   . Arboriculturist in the Last Year:   Transportation Needs:   . Film/video editor (Medical):   Marland Kitchen Lack of Transportation  (Non-Medical):   Physical Activity:   . Days of Exercise per Week:   . Minutes of Exercise per Session:   Stress:   . Feeling of Stress :   Social Connections:   . Frequency of Communication with Friends and Family:   . Frequency of Social Gatherings with Friends and Family:   . Attends Religious Services:   . Active Member of Clubs or Organizations:   . Attends Archivist Meetings:   Marland Kitchen Marital Status:   Intimate Partner Violence:   . Fear of Current or Ex-Partner:   . Emotionally Abused:   Marland Kitchen Physically Abused:   . Sexually Abused:     No past surgical history on file.  No family history on file.  Allergies  Allergen Reactions  . Bee Venom Anaphylaxis  . Penicillins Swelling    Has patient had a PCN reaction causing immediate rash, facial/tongue/throat swelling, SOB or lightheadedness with hypotension: YES Has patient had a PCN reaction causing severe rash involving mucus membranes or skin necrosis: NO Has patient had a PCN reaction that required hospitalization. NO Has patient had a PCN reaction occurring within the last 10 years: NO If all of the above answers are "NO", then may proceed with Cephalosporin use.    Current Outpatient  Medications on File Prior to Visit  Medication Sig Dispense Refill  . Amantadine HCl 100 MG tablet Take 100 mg by mouth 2 (two) times daily.  0  . Buprenorphine HCl-Naloxone HCl 8-2 MG FILM   0  . cyclobenzaprine (FLEXERIL) 5 MG tablet Take 1 tablet (5 mg total) by mouth at bedtime. 7 tablet 0  . diclofenac (VOLTAREN) 75 MG EC tablet Take 1 tablet (75 mg total) by mouth 2 (two) times daily. 20 tablet 0  . doxycycline (VIBRA-TABS) 100 MG tablet Take 1 tablet (100 mg total) by mouth 2 (two) times daily. Take twice daily for 10 days then once daily for 10 days 30 tablet 0  . EPINEPHrine 0.3 mg/0.3 mL IJ SOAJ injection IM injection as needed anaphylactic reaction 2 each 0  . escitalopram (LEXAPRO) 10 MG tablet TAKE 1 TABLET BY MOUTH EVERY  DAY IN THE MORNING FOR 1 WEEK THEN TAKE 2 TABLETS EVERY DAY  1  . latanoprost (XALATAN) 0.005 % ophthalmic solution INSTILL 1 DROP INTO BOTH EYES EVERY EVENING  4  . mupirocin ointment (BACTROBAN) 2 % Apply to area twice a day as needed until healed 22 g 0  . predniSONE (STERAPRED UNI-PAK 21 TAB) 10 MG (21) TBPK tablet Taper as directed 21 tablet 0  . pregabalin (LYRICA) 75 MG capsule Take 1 capsule (75 mg total) by mouth 2 (two) times daily. 60 capsule 0  . valACYclovir (VALTREX) 1000 MG tablet Take 1 tablet (1,000 mg total) by mouth 3 (three) times daily. 21 tablet 0   No current facility-administered medications on file prior to visit.    BP (!) 150/91 (BP Location: Left Arm, Patient Position: Sitting, Cuff Size: Large)   Pulse 85   Temp 98.2 F (36.8 C) (Temporal)   Resp 18   Ht 5\' 8"  (1.727 m)   Wt 227 lb (103 kg)   SpO2 99%   BMI 34.52 kg/m       Objective:   Physical Exam    GEN: no acute distress.  Overweight, smoker.  Otherwise looks well HEENT: Atraumatic, Normocephalic.  Ears and Nose: No external deformity. CV: RRR, No M/G/R. No JVD. No thrill. No extra heart sounds. PULM: CTA B, no wheezes, crackles, rhonchi. No retractions. No resp. distress. No accessory muscle use. EXTR: No c/c/e PSYCH: Normally interactive. Conversant.  heent-He has what looks like superficial cellulitis at the left external nare and adjacent upper lip.  Does not extend to the vermilion border.  Oral cavity and tongue appear normal except for poor dentition     Assessment & Plan:  You have persisting apparent infection of nares and upper lip. Will rx clindamycin oral antibiotic and continue mupirocin. I am getting left nares wound culture to see if grows out and to review sensitivity report.  If area expands then be seen in ED.  Make sure eating probiotic rich foods while on antibiotic or even get probiotics over the counter.  Follow up in 7 days or as needed.  If area does not improve  then considering referral to ENT to evaluate nares to evaluate inner area.  Time spent with patient today was 25  minutes which consisted of chart review, discussing diagnosis,  treatment and documentation.

## 2019-08-21 NOTE — Patient Instructions (Signed)
You have persisting apparent infection of nares and upper lip. Will rx clindamycin oral antibiotic and continue mupirocin. I am getting left nares wound culture to see if grows out and to review sensitivity report.  If area expands then be seen in ED.  Make sure eating probiotic rich foods while on antibiotic or even get probiotics over the counter.  Follow up in 7 days or as needed.  If area does not improve then considering referral to ENT to evaluate nares to evaluate inner area.

## 2019-08-23 ENCOUNTER — Telehealth: Payer: Self-pay | Admitting: Medical

## 2019-08-23 NOTE — Telephone Encounter (Signed)
error 

## 2019-08-24 LAB — WOUND CULTURE
MICRO NUMBER:: 10463616
RESULT:: NORMAL
SPECIMEN QUALITY:: ADEQUATE

## 2019-08-28 ENCOUNTER — Encounter: Payer: Self-pay | Admitting: Medical

## 2019-08-28 ENCOUNTER — Ambulatory Visit: Payer: BC Managed Care – PPO | Admitting: Medical

## 2019-08-28 ENCOUNTER — Other Ambulatory Visit: Payer: Self-pay

## 2019-08-28 VITALS — BP 120/70 | HR 94 | Temp 97.7°F | Resp 18 | Ht 68.0 in | Wt 227.0 lb

## 2019-08-28 DIAGNOSIS — Z87891 Personal history of nicotine dependence: Secondary | ICD-10-CM

## 2019-08-28 DIAGNOSIS — L089 Local infection of the skin and subcutaneous tissue, unspecified: Secondary | ICD-10-CM

## 2019-08-28 MED ORDER — SULFAMETHOXAZOLE-TRIMETHOPRIM 800-160 MG PO TABS
1.0000 | ORAL_TABLET | Freq: Two times a day (BID) | ORAL | 0 refills | Status: DC
Start: 2019-08-28 — End: 2022-09-07

## 2019-08-28 MED ORDER — DOXYCYCLINE HYCLATE 100 MG PO TABS: 100.0000 mg | ORAL_TABLET | Freq: Two times a day (BID) | ORAL | 0 refills | Status: DC

## 2019-08-28 NOTE — Patient Instructions (Addendum)
Your area of left nares still appears infected. Though you have been on various antibiotics. This time will add combination doxycycline and bactrim.  Will go ahead and refer you to ENT since area is not improving. If area worsens then notify us during the interim.  Follow up as needed  Use probiotics while on antibiotic.

## 2019-08-28 NOTE — Progress Notes (Signed)
Subjective:    Patient ID: Kyle Frazier, male    DOB: [birth year], 49 y.o.   MRN: [id number]  HPI  Pt in for follow up.  He has some gram positive cluster on culture but not improving with antibiotic.  He recently failed doxycycline oral tabs which Dr. Edilia Bo rx'd.  On follow up with me I treated with clindamycin. And area seemed to get worse. He thinks area looked better after course of doxy although it was no completely better.   Pt had no fever no chills or seats.    Review of Systems  Constitutional: Negative for chills, fatigue and fever.  Respiratory: Negative for cough, chest tightness, shortness of breath and wheezing.   Cardiovascular: Negative for chest pain and palpitations.  Gastrointestinal: Negative for abdominal pain.  Musculoskeletal: Negative for back pain.  Skin:       See hpi.  Neurological: Negative for dizziness, syncope, weakness, light-headedness and numbness.  Hematological: Negative for adenopathy. Does not bruise/bleed easily.  Psychiatric/Behavioral: Negative for behavioral problems and confusion.    No past medical history on file.   Social History   Socioeconomic History  . Marital status: Married    Spouse name: Not on file  . Number of children: Not on file  . Years of education: Not on file  . Highest education level: Not on file  Occupational History  . Not on file  Tobacco Use  . Smoking status: Current Every Day Smoker    Packs/day: 2.00    Types: E-cigarettes  . Smokeless tobacco: Never Used  Substance and Sexual Activity  . Alcohol use: No  . Drug use: Yes    Types: Other-see comments    Comment: used some oxycotin he found  . Sexual activity: Not on file  Other Topics Concern  . Not on file  Social History Narrative  . Not on file   Social Determinants of Health   Financial Resource Strain:   . Difficulty of Paying Living Expenses:   Food Insecurity:   . Worried About Charity fundraiser in the Last Year:   .  Arboriculturist in the Last Year:   Transportation Needs:   . Film/video editor (Medical):   Marland Kitchen Lack of Transportation (Non-Medical):   Physical Activity:   . Days of Exercise per Week:   . Minutes of Exercise per Session:   Stress:   . Feeling of Stress :   Social Connections:   . Frequency of Communication with Friends and Family:   . Frequency of Social Gatherings with Friends and Family:   . Attends Religious Services:   . Active Member of Clubs or Organizations:   . Attends Archivist Meetings:   Marland Kitchen Marital Status:   Intimate Partner Violence:   . Fear of Current or Ex-Partner:   . Emotionally Abused:   Marland Kitchen Physically Abused:   . Sexually Abused:     No past surgical history on file.  No family history on file.  Allergies  Allergen Reactions  . Bee Venom Anaphylaxis  . Penicillins Swelling    Has patient had a PCN reaction causing immediate rash, facial/tongue/throat swelling, SOB or lightheadedness with hypotension: YES Has patient had a PCN reaction causing severe rash involving mucus membranes or skin necrosis: NO Has patient had a PCN reaction that required hospitalization. NO Has patient had a PCN reaction occurring within the last 10 years: NO If all of the above answers are "NO", then may  proceed with Cephalosporin use.    Current Outpatient Medications on File Prior to Visit  Medication Sig Dispense Refill  . Amantadine HCl 100 MG tablet Take 100 mg by mouth 2 (two) times daily.  0  . Buprenorphine HCl-Naloxone HCl 8-2 MG FILM   0  . clindamycin (CLEOCIN) 150 MG capsule Take 1 capsule (150 mg total) by mouth 3 (three) times daily. 30 capsule 0  . cyclobenzaprine (FLEXERIL) 5 MG tablet Take 1 tablet (5 mg total) by mouth at bedtime. 7 tablet 0  . diclofenac (VOLTAREN) 75 MG EC tablet Take 1 tablet (75 mg total) by mouth 2 (two) times daily. 20 tablet 0  . doxycycline (VIBRA-TABS) 100 MG tablet Take 1 tablet (100 mg total) by mouth 2 (two) times  daily. Take twice daily for 10 days then once daily for 10 days 30 tablet 0  . EPINEPHrine 0.3 mg/0.3 mL IJ SOAJ injection IM injection as needed anaphylactic reaction 2 each 0  . escitalopram (LEXAPRO) 10 MG tablet TAKE 1 TABLET BY MOUTH EVERY DAY IN THE MORNING FOR 1 WEEK THEN TAKE 2 TABLETS EVERY DAY  1  . latanoprost (XALATAN) 0.005 % ophthalmic solution INSTILL 1 DROP INTO BOTH EYES EVERY EVENING  4  . mupirocin ointment (BACTROBAN) 2 % Apply to area twice a day as needed until healed 22 g 0  . predniSONE (STERAPRED UNI-PAK 21 TAB) 10 MG (21) TBPK tablet Taper as directed 21 tablet 0  . pregabalin (LYRICA) 75 MG capsule Take 1 capsule (75 mg total) by mouth 2 (two) times daily. 60 capsule 0  . valACYclovir (VALTREX) 1000 MG tablet Take 1 tablet (1,000 mg total) by mouth 3 (three) times daily. 21 tablet 0   No current facility-administered medications on file prior to visit.    BP (!) 147/120 (BP Location: Left Arm, Patient Position: Sitting, Cuff Size: Large)   Pulse 94   Temp 97.7 F (36.5 C) (Temporal)   Resp 18   Ht 5\' 8"  (1.727 m)   Wt 227 lb (103 kg)   SpO2 99%   BMI 34.52 kg/m       Objective:   Physical Exam  GEN: no acute distress.Overweight, smoker. Otherwise looks well  CV: RRR, No M/G/R. No JVD. No thrill. No extra heart sounds. PULM: CTA B, no wheezes, crackles, rhonchi. No retractions. No resp. distress. No accessory muscle use. EXTR:No c/c/e PSYCH:Normally interactive. Conversant.   heent-He has what looks like superficial cellulitisat the left external nare and adjacent upper lip. a bit more bright red mid upper lip and redness around left nares. Inside of nose appears swollen. Oral cavity and tongue appear normal except for poor dentition      Assessment & Plan:  Your area of left nares still appears infected. Though you have been on various antibiotics. This time will add combination doxycycline and bactrim.  Will go ahead and refer you to ENT  since area is not improving. If area worsens then notify during the interim.  Follow up as needed  [place], PA-C   Time spent with patient today was  25 minutes which consisted of chart review, discussing diagnosis, referral treatment and documentation.

## 2019-08-29 ENCOUNTER — Encounter: Payer: Self-pay | Admitting: Medical

## 2019-08-31 ENCOUNTER — Encounter: Payer: Self-pay | Admitting: Medical

## 2019-08-31 NOTE — Telephone Encounter (Signed)
Patient had o/v on 5/18 regarding this. Okay for work note?

## 2019-08-31 NOTE — Telephone Encounter (Signed)
Patient requesting for this week 5/17-5/21

## 2019-09-05 ENCOUNTER — Encounter (INDEPENDENT_AMBULATORY_CARE_PROVIDER_SITE_OTHER): Payer: Self-pay | Admitting: Otolaryngology

## 2019-09-05 ENCOUNTER — Ambulatory Visit (INDEPENDENT_AMBULATORY_CARE_PROVIDER_SITE_OTHER): Payer: BC Managed Care – PPO | Admitting: Otolaryngology

## 2019-09-05 ENCOUNTER — Other Ambulatory Visit: Payer: Self-pay

## 2019-09-05 VITALS — Temp 97.0°F

## 2019-09-05 DIAGNOSIS — J3489 Other specified disorders of nose and nasal sinuses: Secondary | ICD-10-CM | POA: Diagnosis not present

## 2019-09-05 NOTE — Progress Notes (Signed)
HPI: Kyle Frazier is a 49 y.o. male who presents is referred by his PCP for evaluation of left nasal and left upper lip infection.  Patient states that he has had this for over a month.  He has been on doxycycline 100 mg twice daily for several weeks as well as a course of prednisone and use of mupirocin 2% ointment daily.  This is very tender and stains and involves mostly the left nostril and left upper lip.  Denies trouble breathing through the nose.  No mucopurulent discharge noted from the nose.Marland Kitchen  No past medical history on file. No past surgical history on file. Social History   Socioeconomic History  . Marital status: Married    Spouse name: Not on file  . Number of children: Not on file  . Years of education: Not on file  . Highest education level: Not on file  Occupational History  . Not on file  Tobacco Use  . Smoking status: Current Every Day Smoker    Packs/day: 2.00    Years: 40.00    Pack years: 80.00    Types: E-cigarettes    Start date: 43  . Smokeless tobacco: Never Used  Substance and Sexual Activity  . Alcohol use: No  . Drug use: Yes    Types: Other-see comments    Comment: used some oxycotin he found  . Sexual activity: Not on file  Other Topics Concern  . Not on file  Social History Narrative  . Not on file   Social Determinants of Health   Financial Resource Strain:   . Difficulty of Paying Living Expenses:   Food Insecurity:   . Worried About Charity fundraiser in the Last Year:   . Arboriculturist in the Last Year:   Transportation Needs:   . Film/video editor (Medical):   Marland Kitchen Lack of Transportation (Non-Medical):   Physical Activity:   . Days of Exercise per Week:   . Minutes of Exercise per Session:   Stress:   . Feeling of Stress :   Social Connections:   . Frequency of Communication with Friends and Family:   . Frequency of Social Gatherings with Friends and Family:   . Attends Religious Services:   . Active Member of Clubs or  Organizations:   . Attends Archivist Meetings:   Marland Kitchen Marital Status:    No family history on file. Allergies  Allergen Reactions  . Bee Venom Anaphylaxis  . Penicillins Swelling    Has patient had a PCN reaction causing immediate rash, facial/tongue/throat swelling, SOB or lightheadedness with hypotension: YES Has patient had a PCN reaction causing severe rash involving mucus membranes or skin necrosis: NO Has patient had a PCN reaction that required hospitalization. NO Has patient had a PCN reaction occurring within the last 10 years: NO If all of the above answers are "NO", then may proceed with Cephalosporin use.   Prior to Admission medications   Medication Sig Start Date End Date Taking? Authorizing Provider  Amantadine HCl 100 MG tablet Take 100 mg by mouth 2 (two) times daily. 09/09/17   [provider]  Buprenorphine HCl-Naloxone HCl 8-2 MG FILM  11/16/17   [provider]  clindamycin (CLEOCIN) 150 MG capsule Take 1 capsule (150 mg total) by mouth 3 (three) times daily. 08/21/19   Saguier, Percell Miller, PA-C  cyclobenzaprine (FLEXERIL) 5 MG tablet Take 1 tablet (5 mg total) by mouth at bedtime. 06/29/19   Saguier, Percell Miller,  PA-C  diclofenac (VOLTAREN) 75 MG EC tablet Take 1 tablet (75 mg total) by mouth 2 (two) times daily. 06/29/19   Saguier, Ramon Dredge, PA-C  doxycycline (VIBRA-TABS) 100 MG tablet Take 1 tablet (100 mg total) by mouth 2 (two) times daily. Take twice daily for 10 days then once daily for 10 days 08/28/19   Saguier, Ramon Dredge, PA-C  EPINEPHrine 0.3 mg/0.3 mL IJ SOAJ injection IM injection as needed anaphylactic reaction 06/26/19   Saguier, Ramon Dredge, PA-C  escitalopram (LEXAPRO) 10 MG tablet TAKE 1 TABLET BY MOUTH EVERY DAY IN THE MORNING FOR 1 WEEK THEN TAKE 2 TABLETS EVERY DAY 11/09/17   [provider]  latanoprost (XALATAN) 0.005 % ophthalmic solution INSTILL 1 DROP INTO BOTH EYES EVERY EVENING 11/04/17   [provider]  mupirocin ointment  (BACTROBAN) 2 % Apply to area twice a day as needed until healed 08/02/19   Copland, Gwenlyn Found, MD  predniSONE (STERAPRED UNI-PAK 21 TAB) 10 MG (21) TBPK tablet Taper as directed 11/23/17   Olive Bass, FNP  pregabalin (LYRICA) 75 MG capsule Take 1 capsule (75 mg total) by mouth 2 (two) times daily. 11/23/17   Olive Bass, FNP  sulfamethoxazole-trimethoprim (BACTRIM DS) 800-160 MG tablet Take 1 tablet by mouth 2 (two) times daily. 08/28/19   Saguier, Ramon Dredge, PA-C  valACYclovir (VALTREX) 1000 MG tablet Take 1 tablet (1,000 mg total) by mouth 3 (three) times daily. 11/23/17   Olive Bass, FNP     Positive ROS: Otherwise negative  All other systems have been reviewed and were otherwise negative with the exception of those mentioned in the HPI and as above.  Physical Exam: Constitutional: Alert, well-appearing, no acute distress Ears: External ears without lesions or tenderness. Ear canals are clear bilaterally with intact, clear TMs.  Nasal: Patient has cracking of the skin and scaling of the left nostril and has erythema that extends down into the left upper lip region.  This is soft to palpation and appears to be more of a skin reaction.  There is no evidence of abscess.  On nasal endoscopy nasal passages were clear bilaterally both middle meatus regions were clear with no evidence of active sinus infection.  He had mild septal deformity.  No clinical signs of intranasal infection. Oral: Lips and gums without lesions. Tongue and palate mucosa without lesions. Posterior oropharynx clear. Neck: No palpable adenopathy or masses Respiratory: Breathing comfortably  Skin: No facial/neck lesions or rash noted.  Nasal/sinus endoscopy  Date/Time: 09/05/2019 9:19 AM Performed by: Drema Halon, MD Authorized by: Drema Halon, MD   Consent:    Consent obtained:  Verbal   Consent given by:  Patient Procedure details:    Indications: sino-nasal symptoms      Medication:  Afrin   Instrument: flexible fiberoptic nasal endoscope     Scope location: bilateral nare   Septum:    normal   Sinus:    Right middle meatus: normal     Left middle meatus: normal     Right nasopharynx: normal     Left nasopharynx: normal   Comments:     On nasal endoscopy sinus regions were clear with no evidence of acute or chronic sinus infection.    Assessment: Left nasal vestibulitis with left upper lip skin inflammation.  Plan: Doubt this is a bacterial infection as it is not responded to topical antibiotic or oral antibiotics.  Possibly could represent a fungal type infection or reactive dermatitis. I discussed with patient concerning  need to see a dermatologist who should have a better idea of treating this. In the meantime recommended keeping the area clean with soap and water and will continue application of the mupirocin 2% ointment and also prescribed trial of Diprolene 0.05% ointment which is a steroid ointment.   Narda Bonds, MD   CC:

## 2020-02-15 DIAGNOSIS — Z20822 Contact with and (suspected) exposure to covid-19: Secondary | ICD-10-CM | POA: Diagnosis not present

## 2020-02-15 DIAGNOSIS — Z03818 Encounter for observation for suspected exposure to other biological agents ruled out: Secondary | ICD-10-CM | POA: Diagnosis not present

## 2020-02-25 DIAGNOSIS — F1120 Opioid dependence, uncomplicated: Secondary | ICD-10-CM | POA: Diagnosis not present

## 2020-03-03 DIAGNOSIS — F1120 Opioid dependence, uncomplicated: Secondary | ICD-10-CM | POA: Diagnosis not present

## 2020-03-10 DIAGNOSIS — F1120 Opioid dependence, uncomplicated: Secondary | ICD-10-CM | POA: Diagnosis not present

## 2020-04-01 DIAGNOSIS — F1120 Opioid dependence, uncomplicated: Secondary | ICD-10-CM | POA: Diagnosis not present

## 2020-04-09 DIAGNOSIS — F1120 Opioid dependence, uncomplicated: Secondary | ICD-10-CM | POA: Diagnosis not present

## 2022-07-16 ENCOUNTER — Ambulatory Visit
Admission: EM | Admit: 2022-07-16 | Discharge: 2022-07-16 | Disposition: A | Discharge status: Home / Self Care | Payer: Self-pay | Attending: Nurse Practitioner | Admitting: Nurse Practitioner

## 2022-07-16 DIAGNOSIS — J209 Acute bronchitis, unspecified: Secondary | ICD-10-CM

## 2022-07-16 DIAGNOSIS — J019 Acute sinusitis, unspecified: Secondary | ICD-10-CM

## 2022-07-16 MED ORDER — AZITHROMYCIN 250 MG PO TABS: 250.0000 mg | ORAL_TABLET | Freq: Every day | ORAL | 0 refills | Status: DC

## 2022-07-16 MED ORDER — PREDNISONE 20 MG PO TABS: 40.0000 mg | ORAL_TABLET | Freq: Every day | ORAL | 0 refills | Status: AC

## 2022-07-16 MED ORDER — ALBUTEROL SULFATE HFA 108 (90 BASE) MCG/ACT IN AERS: 1.0000 | INHALATION_SPRAY | Freq: Four times a day (QID) | RESPIRATORY_TRACT | 0 refills | Status: DC | PRN

## 2022-07-16 NOTE — ED Triage Notes (Signed)
Pt reports having headache, facial pressure, and he states he has been seeing black spots.   Started: about a week ago   Home interventions: OTC sinus medication

## 2022-07-16 NOTE — Discharge Instructions (Signed)
Antibiotic as prescribed Albuterol inhaler as needed Prednisone daily for 5 days Please follow-up with your PCP if your symptoms or not improving Please go to the emergency room if you have any worsening symptoms

## 2022-07-16 NOTE — ED Provider Notes (Signed)
[facility]    CSN: [id number] Arrival date & time: 07/16/22  1309      History   Chief Complaint Chief Complaint  Patient presents with   facial pressure   Headache    HPI Kyle Frazier is a 52 y.o. male  presents for evaluation of URI symptoms for 7 days. Patient reports associated symptoms of productive cough with purulent phlegm, congestion, sinus pressure/pain/headache, shortness of breath. Denies N/V/D, sore throat, fevers, ear pain, body aches. Patient does not have a hx of asthma.  He is an active smoker and was told 10 years ago he may have the beginning onset of COPD but has not been told that since.  No known sick contacts.  Pt has taken sinus medication OTC for symptoms. Pt has no other concerns at this time.    Headache Associated symptoms: congestion, cough and sinus pressure     History reviewed. No pertinent past medical history.  There are no problems to display for this patient.   History reviewed. No pertinent surgical history.     Home Medications    Prior to Admission medications   Medication Sig Start Date End Date Taking? Authorizing Provider  Amantadine HCl 100 MG tablet Take 100 mg by mouth 2 (two) times daily. 09/09/17   [provider]  Buprenorphine HCl-Naloxone HCl 8-2 MG FILM  11/16/17   [provider]  clindamycin (CLEOCIN) 150 MG capsule Take 1 capsule (150 mg total) by mouth 3 (three) times daily. 08/21/19   Saguier, Ramon Dredge, PA-C  cyclobenzaprine (FLEXERIL) 5 MG tablet Take 1 tablet (5 mg total) by mouth at bedtime. 06/29/19   Saguier, Ramon Dredge, PA-C  diclofenac (VOLTAREN) 75 MG EC tablet Take 1 tablet (75 mg total) by mouth 2 (two) times daily. 06/29/19   Saguier, Ramon Dredge, PA-C  doxycycline (VIBRA-TABS) 100 MG tablet Take 1 tablet (100 mg total) by mouth 2 (two) times daily. Take twice daily for 10 days then once daily for 10 days 08/28/19   Saguier, Ramon Dredge, PA-C  EPINEPHrine 0.3 mg/0.3 mL IJ SOAJ injection IM  injection as needed anaphylactic reaction 06/26/19   Saguier, Ramon Dredge, PA-C  escitalopram (LEXAPRO) 10 MG tablet TAKE 1 TABLET BY MOUTH EVERY DAY IN THE MORNING FOR 1 WEEK THEN TAKE 2 TABLETS EVERY DAY 11/09/17   [provider]  latanoprost (XALATAN) 0.005 % ophthalmic solution INSTILL 1 DROP INTO BOTH EYES EVERY EVENING 11/04/17   [provider]  mupirocin ointment (BACTROBAN) 2 % Apply to area twice a day as needed until healed 08/02/19   Copland, Gwenlyn Found, MD  predniSONE (STERAPRED UNI-PAK 21 TAB) 10 MG (21) TBPK tablet Taper as directed 11/23/17   Olive Bass, FNP  pregabalin (LYRICA) 75 MG capsule Take 1 capsule (75 mg total) by mouth 2 (two) times daily. 11/23/17   Olive Bass, FNP  sulfamethoxazole-trimethoprim (BACTRIM DS) 800-160 MG tablet Take 1 tablet by mouth 2 (two) times daily. 08/28/19   Saguier, Ramon Dredge, PA-C  valACYclovir (VALTREX) 1000 MG tablet Take 1 tablet (1,000 mg total) by mouth 3 (three) times daily. 11/23/17   Olive Bass, FNP    Family History History reviewed. No pertinent family history.  Social History Social History   Tobacco Use   Smoking status: Every Day    Packs/day: 2.00    Years: 40.00    Additional pack years: 0.00    Total pack years: 80.00    Types: E-cigarettes, Cigarettes    Start date: 1983  Smokeless tobacco: Never  Vaping Use   Vaping Use: Some days  Substance Use Topics   Alcohol use: No   Drug use: Not Currently    Types: Other-see comments    Comment: used some oxycotin he found     Allergies   Bee venom and Penicillins   Review of Systems Review of Systems  HENT:  Positive for congestion, sinus pressure and sinus pain.   Respiratory:  Positive for cough and shortness of breath.   Neurological:  Positive for headaches.     Physical Exam Triage Vital Signs ED Triage Vitals  Enc Vitals Group     BP 07/16/22 1458 (!) 165/93     Pulse Rate 07/16/22 1458 82     Resp 07/16/22  1458 18     Temp 07/16/22 1458 97.8 F (36.6 C)     Temp Source 07/16/22 1458 Oral     SpO2 07/16/22 1458 96 %     Weight --      Height --      Head Circumference --      Peak Flow --      Pain Score 07/16/22 1456 8     Pain Loc --      Pain Edu? --      Excl. in GC? --    No data found.  Updated Vital Signs BP (!) 165/93 (BP Location: Right Arm)   Pulse 82   Temp 97.8 F (36.6 C) (Oral)   Resp 18   SpO2 96%   Visual Acuity Right Eye Distance:   Left Eye Distance:   Bilateral Distance:    Right Eye Near:   Left Eye Near:    Bilateral Near:     Physical Exam Vitals and nursing note reviewed.  Constitutional:      General: He is not in acute distress.    Appearance: Normal appearance. He is not ill-appearing or toxic-appearing.  HENT:     Head: Normocephalic and atraumatic.     Right Ear: Tympanic membrane and ear canal normal.     Left Ear: Tympanic membrane and ear canal normal.     Nose: Congestion present.     Mouth/Throat:     Mouth: Mucous membranes are moist.     Pharynx: No posterior oropharyngeal erythema.  Eyes:     Pupils: Pupils are equal, round, and reactive to light.  Cardiovascular:     Rate and Rhythm: Normal rate and regular rhythm.     Heart sounds: Normal heart sounds.  Pulmonary:     Effort: Pulmonary effort is normal.     Breath sounds: Normal breath sounds. No wheezing.  Musculoskeletal:     Cervical back: Normal range of motion and neck supple.  Lymphadenopathy:     Cervical: No cervical adenopathy.  Skin:    General: Skin is warm and dry.  Neurological:     General: No focal deficit present.     Mental Status: He is alert and oriented to person, place, and time.  Psychiatric:        Mood and Affect: Mood normal.        Behavior: Behavior normal.      UC Treatments / Results  Labs (all labs ordered are listed, but only abnormal results are displayed) Labs Reviewed - No data to display  EKG   Radiology No results  found.  Procedures Procedures (including critical care time)  Medications Ordered in UC Medications - No data to display  Initial  Impression / Assessment and Plan / UC Course  I have reviewed the triage vital signs and the nursing notes.  Pertinent labs & imaging results that were available during my care of the patient were reviewed by me and considered in my medical decision making (see chart for details).     Reviewed exam and symptoms with patient.  Concern for undiagnosed COPD with possible current exacerbation Albuterol inhaler as needed Zithromax as prescribed.  Patient with severe allergy to penicillin and has not been on a cephalosporin. Prednisone x 5 days Rest and fluids PCP follow-up in 2 days for recheck ER precautions reviewed and patient verbalized understanding  Final Clinical Impressions(s) / UC Diagnoses   Final diagnoses:  Acute bronchitis, unspecified organism  Acute non-recurrent sinusitis, unspecified location     Discharge Instructions      Antibiotic as prescribed Albuterol inhaler as needed Prednisone daily for 5 days Please follow-up with your PCP if your symptoms or not improving Please go to the emergency room if you have any worsening symptoms   ED Prescriptions   None    PDMP not reviewed this encounter.   Radford PaxMayer, Jodi R, NP 07/16/22 [phone]

## 2022-09-07 ENCOUNTER — Ambulatory Visit
Admission: EM | Admit: 2022-09-07 | Discharge: 2022-09-07 | Disposition: A | Discharge status: Home / Self Care | Payer: Self-pay | Attending: Nurse Practitioner | Admitting: Nurse Practitioner

## 2022-09-07 DIAGNOSIS — K047 Periapical abscess without sinus: Secondary | ICD-10-CM

## 2022-09-07 MED ORDER — CLINDAMYCIN HCL 300 MG PO CAPS
300.0000 mg | ORAL_CAPSULE | Freq: Four times a day (QID) | ORAL | 0 refills | Status: AC
Start: 2022-09-07 — End: 2022-09-14

## 2022-09-07 NOTE — ED Provider Notes (Signed)
[facility]    CSN: [id number] Arrival date & time: 09/07/22  0840      History   Chief Complaint Chief Complaint  Patient presents with   oral abscess    HPI Kyle Frazier is a 52 y.o. male presents for evaluation of a dental infection.  Patient reports 5 days of left lower gum/mild facial swelling.  Does report he has a broken tooth for several months that is now beginning to give him issue.  Denies fevers or chills.  Does have a history of dental abscesses in the past.  He has been taking ibuprofen OTC for symptoms.  He states he has not established with a dentist.  No other concerns at this time.  HPI  History reviewed. No pertinent past medical history.  There are no problems to display for this patient.   History reviewed. No pertinent surgical history.     Home Medications    Prior to Admission medications   Medication Sig Start Date End Date Taking? Authorizing Provider  clindamycin (CLEOCIN) 300 MG capsule Take 1 capsule (300 mg total) by mouth 4 (four) times daily for 7 days. 09/07/22 09/14/22 Yes Radford Pax, NP  albuterol (VENTOLIN HFA) 108 (90 Base) MCG/ACT inhaler Inhale 1-2 puffs into the lungs every 6 (six) hours as needed for wheezing or shortness of breath. 07/16/22   Radford Pax, NP  Amantadine HCl 100 MG tablet Take 100 mg by mouth 2 (two) times daily. 09/09/17   [provider]  Buprenorphine HCl-Naloxone HCl 8-2 MG FILM  11/16/17   [provider]  cyclobenzaprine (FLEXERIL) 5 MG tablet Take 1 tablet (5 mg total) by mouth at bedtime. 06/29/19   Saguier, Ramon Dredge, PA-C  diclofenac (VOLTAREN) 75 MG EC tablet Take 1 tablet (75 mg total) by mouth 2 (two) times daily. 06/29/19   Saguier, Ramon Dredge, PA-C  EPINEPHrine 0.3 mg/0.3 mL IJ SOAJ injection IM injection as needed anaphylactic reaction 06/26/19   Saguier, Ramon Dredge, PA-C  escitalopram (LEXAPRO) 10 MG tablet TAKE 1 TABLET BY MOUTH EVERY DAY IN THE MORNING FOR 1 WEEK THEN TAKE 2 TABLETS  EVERY DAY 11/09/17   [provider]  latanoprost (XALATAN) 0.005 % ophthalmic solution INSTILL 1 DROP INTO BOTH EYES EVERY EVENING 11/04/17   [provider]  mupirocin ointment (BACTROBAN) 2 % Apply to area twice a day as needed until healed 08/02/19   Copland, Gwenlyn Found, MD  pregabalin (LYRICA) 75 MG capsule Take 1 capsule (75 mg total) by mouth 2 (two) times daily. 11/23/17   Olive Bass, FNP  valACYclovir (VALTREX) 1000 MG tablet Take 1 tablet (1,000 mg total) by mouth 3 (three) times daily. 11/23/17   Olive Bass, FNP    Family History History reviewed. No pertinent family history.  Social History Social History   Tobacco Use   Smoking status: Every Day    Packs/day: 2.00    Years: 40.00    Additional pack years: 0.00    Total pack years: 80.00    Types: E-cigarettes, Cigarettes    Start date: 1983   Smokeless tobacco: Never  Vaping Use   Vaping Use: Some days  Substance Use Topics   Alcohol use: No   Drug use: Not Currently    Types: Other-see comments    Comment: used some oxycotin he found     Allergies   Bee venom and Penicillins   Review of Systems Review of Systems  HENT:  Positive for dental problem.  Physical Exam Triage Vital Signs ED Triage Vitals  Enc Vitals Group     BP 09/07/22 0902 (!) 158/111     Pulse Rate 09/07/22 0902 68     Resp 09/07/22 0902 17     Temp 09/07/22 0902 97.7 F (36.5 C)     Temp Source 09/07/22 0902 Oral     SpO2 09/07/22 0902 94 %     Weight --      Height --      Head Circumference --      Peak Flow --      Pain Score 09/07/22 0901 7     Pain Loc --      Pain Edu? --      Excl. in GC? --    No data found.  Updated Vital Signs BP 139/75   Pulse 68   Temp 97.7 F (36.5 C) (Oral)   Resp 17   SpO2 94%   Visual Acuity Right Eye Distance:   Left Eye Distance:   Bilateral Distance:    Right Eye Near:   Left Eye Near:    Bilateral Near:     Physical Exam Vitals  and nursing note reviewed.  Constitutional:      General: He is not in acute distress.    Appearance: Normal appearance. He is not ill-appearing, toxic-appearing or diaphoretic.  HENT:     Head: Normocephalic and atraumatic.     Mouth/Throat:     Lips: Pink.     Mouth: Mucous membranes are moist.     Pharynx: Oropharynx is clear. Uvula midline. No pharyngeal swelling or uvula swelling.      Comments: Several missing/broken teeth with dental caries noted in remaining teeth.  Tooth #20 is broken.  Mild erythema of the gumline.  No palpable or visible abscess.  There is minimal left lower facial swelling without erythema. Eyes:     Pupils: Pupils are equal, round, and reactive to light.  Cardiovascular:     Rate and Rhythm: Normal rate.  Pulmonary:     Effort: Pulmonary effort is normal.  Skin:    General: Skin is warm and dry.  Neurological:     General: No focal deficit present.     Mental Status: He is alert and oriented to person, place, and time.  Psychiatric:        Mood and Affect: Mood normal.        Behavior: Behavior normal.      UC Treatments / Results  Labs (all labs ordered are listed, but only abnormal results are displayed) Labs Reviewed - No data to display  EKG   Radiology No results found.  Procedures Procedures (including critical care time)  Medications Ordered in UC Medications - No data to display  Initial Impression / Assessment and Plan / UC Course  I have reviewed the triage vital signs and the nursing notes.  Pertinent labs & imaging results that were available during my care of the patient were reviewed by me and considered in my medical decision making (see chart for details).     Reviewed exam and symptoms with patient.  He does have an allergy to penicillin.  Will start clindamycin.  Side effect profile reviewed and he was also instructed to start a probiotic Salt water gargles and he may continue OTC analgesics as needed Strongly  encourage patient to reach out to his insurance to see who is covered for dental and follow-up with a dentist as soon as possible.  Discussed antibiotics are just a Band-Aid and symptoms will recur/worsen until the tooth is treated by a dentist He was instructed go to the ER for any worsening symptoms, red flags reviewed and patient verbalized understanding Final Clinical Impressions(s) / UC Diagnoses   Final diagnoses:  Dental infection     Discharge Instructions      Start clindamycin 4 times a day for 7 days.  Take a probiotic with this medication as this can cause diarrhea You may do salt water gargles as needed Continue over-the-counter ibuprofen or Tylenol as needed for pain Please follow-up with a dentist ASAP for further treatment options Please go to the ER if you develop any worsening symptoms this includes but is not limited to fever, facial swelling, or any new concerns that arise    ED Prescriptions     Medication Sig Dispense Auth. Provider   clindamycin (CLEOCIN) 300 MG capsule Take 1 capsule (300 mg total) by mouth 4 (four) times daily for 7 days. 28 capsule Radford Pax, NP      PDMP not reviewed this encounter.   Radford Pax, NP 09/07/22 [phone]

## 2022-09-07 NOTE — ED Triage Notes (Signed)
Pt presents with c/o possible oral abscess. C/o dental/mouth pain x 5 days.   Home interventions: Ibuprofen

## 2022-09-07 NOTE — Discharge Instructions (Signed)
Start clindamycin 4 times a day for 7 days.  Take a probiotic with this medication as this can cause diarrhea You may do salt water gargles as needed Continue over-the-counter ibuprofen or Tylenol as needed for pain Please follow-up with a dentist ASAP for further treatment options Please go to the ER if you develop any worsening symptoms this includes but is not limited to fever, facial swelling, or any new concerns that arise

## 2022-10-21 ENCOUNTER — Ambulatory Visit
Admission: EM | Admit: 2022-10-21 | Discharge: 2022-10-21 | Disposition: A | Discharge status: Home / Self Care | Payer: BC Managed Care – PPO | Attending: Family Medicine | Admitting: Family Medicine

## 2022-10-21 DIAGNOSIS — L309 Dermatitis, unspecified: Secondary | ICD-10-CM

## 2022-10-21 MED ORDER — PREDNISONE 20 MG PO TABS: 40.0000 mg | ORAL_TABLET | Freq: Every day | ORAL | 0 refills | Status: AC

## 2022-10-21 MED ORDER — METHYLPREDNISOLONE ACETATE 80 MG/ML IJ SUSP
80.0000 mg | Freq: Once | INTRAMUSCULAR | Status: AC
  Administered 2022-10-21: 80 mg via INTRAMUSCULAR

## 2022-10-21 NOTE — Discharge Instructions (Signed)
You have been given an injection of methylprednisolone 80 mg, steroid.  Take prednisone 20 mg--2 daily for 5 days   You can take Benadryl at night to help you rest.

## 2022-10-21 NOTE — ED Provider Notes (Signed)
[facility]    CSN: [id number] Arrival date & time: 10/21/22  1324      History   Chief Complaint Chief Complaint  Patient presents with   Rash    HPI Kyle Frazier is a 52 y.o. male.    Rash  He has had a rash on his neck that is burning.  Is been going on for a few weeks.  No fever or chills and no dyspnea.  He has had this rash previously when it is hot and is exacerbated by his perspiring.  He is allergic to penicillin which causes swelling  No history of diabetes  History reviewed. No pertinent past medical history.  There are no problems to display for this patient.   History reviewed. No pertinent surgical history.     Home Medications    Prior to Admission medications   Medication Sig Start Date End Date Taking? Authorizing Provider  predniSONE (DELTASONE) 20 MG tablet Take 2 tablets (40 mg total) by mouth daily with breakfast for 5 days. 10/21/22 10/26/22 Yes Cherilynn Schomburg, Janace Aris, MD  albuterol (VENTOLIN HFA) 108 (90 Base) MCG/ACT inhaler Inhale 1-2 puffs into the lungs every 6 (six) hours as needed for wheezing or shortness of breath. 07/16/22   Radford Pax, NP  diclofenac (VOLTAREN) 75 MG EC tablet Take 1 tablet (75 mg total) by mouth 2 (two) times daily. 06/29/19   Saguier, Ramon Dredge, PA-C  EPINEPHrine 0.3 mg/0.3 mL IJ SOAJ injection IM injection as needed anaphylactic reaction 06/26/19   Saguier, Ramon Dredge, PA-C  latanoprost (XALATAN) 0.005 % ophthalmic solution INSTILL 1 DROP INTO BOTH EYES EVERY EVENING 11/04/17   [provider]    Family History History reviewed. No pertinent family history.  Social History Social History   Tobacco Use   Smoking status: Every Day    Current packs/day: 2.00    Average packs/day: 2.0 packs/day for 41.5 years (83.0 ttl pk-yrs)    Types: E-cigarettes, Cigarettes    Start date: 1983   Smokeless tobacco: Never  Vaping Use   Vaping status: Some Days  Substance Use Topics   Alcohol use: No   Drug  use: Not Currently    Types: Other-see comments    Comment: used some oxycotin he found     Allergies   Bee venom and Penicillins   Review of Systems Review of Systems  Skin:  Positive for rash.     Physical Exam Triage Vital Signs ED Triage Vitals  Encounter Vitals Group     BP 10/21/22 1337 (!) 152/87     Systolic BP Percentile --      Diastolic BP Percentile --      Pulse Rate 10/21/22 1337 69     Resp 10/21/22 1337 20     Temp 10/21/22 1337 97.9 F (36.6 C)     Temp Source 10/21/22 1337 Oral     SpO2 10/21/22 1337 96 %     Weight --      Height --      Head Circumference --      Peak Flow --      Pain Score 10/21/22 1339 0     Pain Loc --      Pain Education --      Exclude from Growth Chart --    No data found.  Updated Vital Signs BP (!) 152/87 (BP Location: Right Arm)   Pulse 69   Temp 97.9 F (36.6 C) (Oral)   Resp 20  SpO2 96%   Visual Acuity Right Eye Distance:   Left Eye Distance:   Bilateral Distance:    Right Eye Near:   Left Eye Near:    Bilateral Near:     Physical Exam Vitals reviewed.  Constitutional:      General: He is not in acute distress.    Appearance: He is not ill-appearing, toxic-appearing or diaphoretic.  Neck:     Comments: There is a maculopapular rash that is erythematous on his posterior neck and both sides of his neck.  There is no sign of secondary infection.  There is no drainage or ulceration. Cardiovascular:     Rate and Rhythm: Normal rate and regular rhythm.  Pulmonary:     Effort: Pulmonary effort is normal.     Breath sounds: Normal breath sounds.  Lymphadenopathy:     Cervical: No cervical adenopathy.  Skin:    Coloration: Skin is not pale.  Neurological:     General: No focal deficit present.     Mental Status: He is oriented to person, place, and time.  Psychiatric:        Behavior: Behavior normal.      UC Treatments / Results  Labs (all labs ordered are listed, but only abnormal results  are displayed) Labs Reviewed - No data to display  EKG   Radiology No results found.  Procedures Procedures (including critical care time)  Medications Ordered in UC Medications  methylPREDNISolone acetate (DEPO-MEDROL) injection 80 mg (has no administration in time range)    Initial Impression / Assessment and Plan / UC Course  I have reviewed the triage vital signs and the nursing notes.  Pertinent labs & imaging results that were available during my care of the patient were reviewed by me and considered in my medical decision making (see chart for details).        Depo-Medrol injection is given here and prednisone for 5 days is sent in.  I recommended Benadryl at night just to help him rest. Final Clinical Impressions(s) / UC Diagnoses   Final diagnoses:  Dermatitis     Discharge Instructions      You have been given an injection of methylprednisolone 80 mg, steroid.  Take prednisone 20 mg--2 daily for 5 days   You can take Benadryl at night to help you rest.     ED Prescriptions     Medication Sig Dispense Auth. Provider   predniSONE (DELTASONE) 20 MG tablet Take 2 tablets (40 mg total) by mouth daily with breakfast for 5 days. 10 tablet Marlinda Mike Janace Aris, MD      I have reviewed the PDMP during this encounter.   Zenia Resides, MD 10/21/22 [phone]

## 2022-10-21 NOTE — ED Triage Notes (Signed)
Pt reports rash on neck x 1 week. Reports this rash flared up when is hot.

## 2023-04-01 ENCOUNTER — Emergency Department (HOSPITAL_BASED_OUTPATIENT_CLINIC_OR_DEPARTMENT_OTHER)
Admission: EM | Admit: 2023-04-01 | Discharge: 2023-04-01 | Disposition: A | Discharge status: Home / Self Care | Payer: Worker's Compensation | Attending: Emergency Medicine | Admitting: Emergency Medicine

## 2023-04-01 ENCOUNTER — Other Ambulatory Visit: Payer: Self-pay

## 2023-04-01 ENCOUNTER — Emergency Department (HOSPITAL_BASED_OUTPATIENT_CLINIC_OR_DEPARTMENT_OTHER): Payer: Self-pay

## 2023-04-01 ENCOUNTER — Encounter (HOSPITAL_BASED_OUTPATIENT_CLINIC_OR_DEPARTMENT_OTHER): Payer: Self-pay

## 2023-04-01 DIAGNOSIS — M25561 Pain in right knee: Secondary | ICD-10-CM | POA: Diagnosis present

## 2023-04-01 DIAGNOSIS — M25461 Effusion, right knee: Secondary | ICD-10-CM | POA: Diagnosis not present

## 2023-04-01 DIAGNOSIS — S8391XA Sprain of unspecified site of right knee, initial encounter: Secondary | ICD-10-CM

## 2023-04-01 MED ORDER — OXYCODONE-ACETAMINOPHEN 5-325 MG PO TABS: 1.0000 | ORAL_TABLET | Freq: Four times a day (QID) | ORAL | 0 refills | Status: DC | PRN

## 2023-04-01 MED ORDER — OXYCODONE-ACETAMINOPHEN 5-325 MG PO TABS
2.0000 | ORAL_TABLET | Freq: Once | ORAL | Status: AC
  Administered 2023-04-01: 2 via ORAL
  Filled 2023-04-01: qty 2

## 2023-04-01 NOTE — ED Provider Notes (Signed)
[facility] Provider Note   CSN: [id number] Arrival date & time: 04/01/23  1818     History  Chief Complaint  Patient presents with   Knee Pain    AUNDREA DEMMONS is a 52 y.o. male otherwise healthy here presenting with right knee pain.  Patient states that he was at work and was bending down and stood up and felt a pop.  He states that he had severe right knee pain afterwards.  He states that he noticed swelling of the right knee and some numbness of the right leg.  Patient denies any trauma or injury.  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  The history is provided by the patient.       Home Medications Prior to Admission medications   Medication Sig Start Date End Date Taking? Authorizing Provider  albuterol (VENTOLIN HFA) 108 (90 Base) MCG/ACT inhaler Inhale 1-2 puffs into the lungs every 6 (six) hours as needed for wheezing or shortness of breath. 07/16/22   Radford Pax, NP  diclofenac (VOLTAREN) 75 MG EC tablet Take 1 tablet (75 mg total) by mouth 2 (two) times daily. 06/29/19   Saguier, Ramon Dredge, PA-C  EPINEPHrine 0.3 mg/0.3 mL IJ SOAJ injection IM injection as needed anaphylactic reaction 06/26/19   Saguier, Ramon Dredge, PA-C  latanoprost (XALATAN) 0.005 % ophthalmic solution INSTILL 1 DROP INTO BOTH EYES EVERY EVENING 11/04/17   [provider]      Allergies    Bee venom and Penicillins    Review of Systems   Review of Systems  Musculoskeletal:        Right knee pain  All other systems reviewed and are negative.   Physical Exam Updated Vital Signs BP (!) 170/89   Pulse 86   Temp 97.9 F (36.6 C) (Oral)   Resp 16   Ht 5\' 8"  (1.727 m)   Wt 122.5 kg   SpO2 100%   BMI 41.05 kg/m  Physical Exam Vitals and nursing note reviewed.  HENT:     Head: Normocephalic.     Nose: Nose normal.     Mouth/Throat:     Mouth: Mucous membranes are moist.  Eyes:     Pupils: Pupils are equal, round, and  reactive to light.  Cardiovascular:     Rate and Rhythm: Normal rate.     Pulses: Normal pulses.  Pulmonary:     Effort: Pulmonary effort is normal.  Abdominal:     General: Abdomen is flat.  Musculoskeletal:     Cervical back: Normal range of motion.     Comments: Patient has obvious right knee effusion.  Patient has some tenderness of the quadricep tendon but there is no obvious deformity.  Patient in particular is able to extend and flex the knee by himself.  Patient has good popliteal pulse.  Patient has subjective decrease sensation of the entire right calf area but patient has good DP pulse as well.  Skin:    General: Skin is warm.     Capillary Refill: Capillary refill takes less than 2 seconds.  Neurological:     General: No focal deficit present.     Mental Status: He is alert and oriented to person, place, and time.  Psychiatric:        Mood and Affect: Mood normal.     ED Results / Procedures / Treatments   Labs (all labs ordered are listed, but only abnormal  results are displayed) Labs Reviewed - No data to display  EKG None  Radiology DG Knee Complete 4 Views Right Result Date: 04/01/2023 CLINICAL DATA:  Pain. EXAM: RIGHT KNEE - COMPLETE 4+ VIEW COMPARISON:  None Available. FINDINGS: No evidence of fracture, dislocation, or significant joint effusion. The knee joint is anatomically aligned with mild medial compartment osteophytosis. No significant focal soft tissue swelling. IMPRESSION: No acute osseous abnormality. Electronically Signed   By: Hart Robinsons M.D.   On: 04/01/2023 20:33    Procedures Procedures    Medications Ordered in ED Medications  oxyCODONE-acetaminophen (PERCOCET/ROXICET) 5-325 MG per tablet 2 tablet (has no administration in time range)    ED Course/ Medical Decision Making/ A&P                                 Medical Decision Making ZICHEN GADD is a 52 y.o. male here presenting with right knee pain and swelling after bending  and standing up.  I suspect that he may have a partial quadriceps tendon tear versus other ligamentous injury.  Patient's x-ray did not show any fracture but he does have a joint effusion.  I told him that he will need to follow-up with Worker's Comp. I did give him a knee immobilizer and crutches.  I told him that he should stay off his leg until he is cleared by [employer].  Will refer to Ortho outpatient and recommend MRI outpatient   Amount and/or Complexity of Data Reviewed Radiology: ordered and independent interpretation performed. Decision-making details documented in ED Course.  Risk Prescription drug management.    Final Clinical Impression(s) / ED Diagnoses Final diagnoses:  None    Rx / DC Orders ED Discharge Orders     None         Charlynne Pander, MD 04/01/23 2042

## 2023-04-01 NOTE — ED Notes (Signed)
Reviewed D/C information with the patient, pt verbalized understanding. No additional concerns at this time.

## 2023-04-01 NOTE — ED Triage Notes (Signed)
The patient had a pop in his right knee and now having pain in his knee. He stated it feels like his right foot is asleep.

## 2023-04-01 NOTE — Discharge Instructions (Addendum)
As we discussed, you likely sprained or tore a ligament.  I recommend you use the knee immobilizer and crutches.  Please rest for several days and try not to bear weight on the leg  I recommend you follow-up with orthopedic doctor and get an outpatient MRI.  You need to get clearance from [employer]. before you can go back to work  Return to ER if you have worse knee swelling or severe pain

## 2023-06-02 ENCOUNTER — Ambulatory Visit (HOSPITAL_COMMUNITY)
Admission: RE | Admit: 2023-06-02 | Discharge: 2023-06-02 | Disposition: A | Discharge status: Home / Self Care | Payer: Worker's Compensation | Source: Ambulatory Visit | Attending: Vascular Surgery | Admitting: Vascular Surgery

## 2023-06-02 ENCOUNTER — Other Ambulatory Visit (HOSPITAL_COMMUNITY): Payer: Self-pay | Admitting: Orthopedic Surgery

## 2023-06-02 DIAGNOSIS — M7989 Other specified soft tissue disorders: Secondary | ICD-10-CM | POA: Insufficient documentation

## 2023-08-27 ENCOUNTER — Ambulatory Visit
Admission: EM | Admit: 2023-08-27 | Discharge: 2023-08-27 | Disposition: A | Discharge status: Home / Self Care | Attending: Family Medicine | Admitting: Family Medicine

## 2023-08-27 ENCOUNTER — Other Ambulatory Visit: Payer: Self-pay

## 2023-08-27 DIAGNOSIS — K0889 Other specified disorders of teeth and supporting structures: Secondary | ICD-10-CM

## 2023-08-27 DIAGNOSIS — K0510 Chronic gingivitis, plaque induced: Secondary | ICD-10-CM

## 2023-08-27 DIAGNOSIS — K08409 Partial loss of teeth, unspecified cause, unspecified class: Secondary | ICD-10-CM

## 2023-08-27 MED ORDER — KETOROLAC TROMETHAMINE 30 MG/ML IJ SOLN
30.0000 mg | Freq: Once | INTRAMUSCULAR | Status: AC
  Administered 2023-08-27: 30 mg via INTRAMUSCULAR

## 2023-08-27 MED ORDER — TRAMADOL HCL 50 MG PO TABS
50.0000 mg | ORAL_TABLET | Freq: Four times a day (QID) | ORAL | 0 refills | Status: DC | PRN
Start: 2023-08-27 — End: 2023-10-26

## 2023-08-27 MED ORDER — CLINDAMYCIN HCL 300 MG PO CAPS
300.0000 mg | ORAL_CAPSULE | Freq: Three times a day (TID) | ORAL | 0 refills | Status: AC
Start: 2023-08-27 — End: 2023-09-03

## 2023-08-27 NOTE — ED Provider Notes (Signed)
 [facility]    CSN: [id number] Arrival date & time: 08/27/23  1315      History   Chief Complaint No chief complaint on file.   HPI Kyle Frazier is a 53 y.o. male pain after dental work.  Patient states 4 days ago he had all of the bottom teeth extracted.  States he has multiple sutures and does have dentures which she was told to keep on top of the gums.  Reports he has been having worsening pain and noticed blisters along the suture line.  Denies drainage, fevers, facial swelling.  He has been trying over-the-counter Tylenol  and ibuprofen without improvement.  He called the on-call office for his dentist and they told him to come to urgent care.  They did not prescribe him any antibiotics or anything for pain after his procedure.  HPI  History reviewed. No pertinent past medical history.  There are no active problems to display for this patient.   Past Surgical History:  Procedure Laterality Date   KNEE CARTILAGE SURGERY         Home Medications    Prior to Admission medications   Medication Sig Start Date End Date Taking? Authorizing Provider  azithromycin  (ZITHROMAX ) 250 MG tablet Take 250 mg by mouth as directed. 08/24/23  Yes [provider]  clindamycin  (CLEOCIN ) 300 MG capsule Take 1 capsule (300 mg total) by mouth 3 (three) times daily for 7 days. 08/27/23 09/03/23 Yes Romond Pipkins, Jodi R, NP  traMADol (ULTRAM) 50 MG tablet Take 1 tablet (50 mg total) by mouth every 6 (six) hours as needed (dental pain). 08/27/23  Yes Edrian Melucci, Jodi R, NP  albuterol  (VENTOLIN  HFA) 108 (90 Base) MCG/ACT inhaler Inhale 1-2 puffs into the lungs every 6 (six) hours as needed for wheezing or shortness of breath. 07/16/22   Emonie Espericueta, Jodi R, NP  EPINEPHrine  0.3 mg/0.3 mL IJ SOAJ injection IM injection as needed anaphylactic reaction 06/26/19   Saguier, Slater Duncan    Family History History reviewed. No pertinent family history.  Social History Social History   Tobacco Use    Smoking status: Every Day    Current packs/day: 2.00    Average packs/day: 2.0 packs/day for 42.4 years (84.7 ttl pk-yrs)    Types: E-cigarettes, Cigarettes    Start date: 1983   Smokeless tobacco: Never  Vaping Use   Vaping status: Some Days  Substance Use Topics   Alcohol use: No   Drug use: Not Currently    Types: Other-see comments    Comment: used some oxycotin he found     Allergies   Bee venom and Penicillins   Review of Systems Review of Systems  HENT:  Positive for dental problem.      Physical Exam Triage Vital Signs ED Triage Vitals  Encounter Vitals Group     BP 08/27/23 1324 (!) 163/104     Systolic BP Percentile --      Diastolic BP Percentile --      Pulse Rate 08/27/23 1324 70     Resp 08/27/23 1324 17     Temp 08/27/23 1324 98.1 F (36.7 C)     Temp Source 08/27/23 1324 Oral     SpO2 08/27/23 1324 94 %     Weight --      Height --      Head Circumference --      Peak Flow --      Pain Score 08/27/23 1322 10     Pain  Loc --      Pain Education --      Exclude from Growth Chart --    No data found.  Updated Vital Signs BP (!) 163/104   Pulse 70   Temp 98.1 F (36.7 C) (Oral)   Resp 17   SpO2 94%   Visual Acuity Right Eye Distance:   Left Eye Distance:   Bilateral Distance:    Right Eye Near:   Left Eye Near:    Bilateral Near:     Physical Exam Vitals and nursing note reviewed.  Constitutional:      General: He is not in acute distress.    Appearance: Normal appearance. He is not ill-appearing.  HENT:     Head: Normocephalic and atraumatic.     Mouth/Throat:     Comments: All teeth from the lower gum have been surgically removed.  There are several stitches noted with scattered blistering surrounding.  No active drainage.  No facial swelling.  There is no tongue or throat swelling. Eyes:     Pupils: Pupils are equal, round, and reactive to light.  Cardiovascular:     Rate and Rhythm: Normal rate.  Pulmonary:     Effort:  Pulmonary effort is normal.  Skin:    General: Skin is warm and dry.  Neurological:     General: No focal deficit present.     Mental Status: He is alert and oriented to person, place, and time.  Psychiatric:        Mood and Affect: Mood normal.        Behavior: Behavior normal.      UC Treatments / Results  Labs (all labs ordered are listed, but only abnormal results are displayed) Labs Reviewed - No data to display  EKG   Radiology No results found.  Procedures Procedures (including critical care time)  Medications Ordered in UC Medications  ketorolac  (TORADOL ) 30 MG/ML injection 30 mg (30 mg Intramuscular Given 08/27/23 1343)    Initial Impression / Assessment and Plan / UC Course  I have reviewed the triage vital signs and the nursing notes.  Pertinent labs & imaging results that were available during my care of the patient were reviewed by me and considered in my medical decision making (see chart for details).     Reviewed exam and symptoms with patient.  He was given Toradol  injection in clinic.  He was monitored for 10 minutes after injection with no reaction noted and tolerated well.  He was instructed no NSAIDs for 24 hours and verbalized understanding.  Will start clindamycin  and tramadol.  Advise continue postop care as instructed by his dentist and to follow-up with them first thing on Monday for additional treatment/reevaluation.  He was instructed to go to the emergency room for any worsening symptoms that occur prior to him seeing his dentist, red flags reviewed and he verbalized understanding. Final Clinical Impressions(s) / UC Diagnoses   Final diagnoses:  Blister of gingiva with infection, initial encounter  Pain, dental  History of tooth extraction, unspecified edentulism class     Discharge Instructions      You were given a Toradol  injection in clinic today. Do not take any over the counter NSAID's such as Advil, ibuprofen, Aleve , or naproxen   for 24 hours. You may take tylenol  if needed.  Start clindamycin  3 times a day for 7 days.  You may take tramadol every 6 hours as needed for pain.  Please follow-up with your dentist on Monday  morning for reevaluation/additional treatment.  Please go to the emergency room ASAP if you develop any worsening symptoms prior to seeing your dentist.  This includes but is not limited to fevers, worsening dental pain, facial swelling, or any new concerns that arise.  Hope you feel better soon!    ED Prescriptions     Medication Sig Dispense Auth. Provider   clindamycin  (CLEOCIN ) 300 MG capsule Take 1 capsule (300 mg total) by mouth 3 (three) times daily for 7 days. 21 capsule Daijha Leggio, Jodi R, NP   traMADol (ULTRAM) 50 MG tablet Take 1 tablet (50 mg total) by mouth every 6 (six) hours as needed (dental pain). 10 tablet Marieann Zipp, Jodi R, NP      I have reviewed the PDMP during this encounter.   Alleen Arbour, NP 08/27/23 1349

## 2023-08-27 NOTE — Discharge Instructions (Signed)
 You were given a Toradol  injection in clinic today. Do not take any over the counter NSAID's such as Advil, ibuprofen, Aleve , or naproxen  for 24 hours. You may take tylenol  if needed.  Start clindamycin  3 times a day for 7 days.  You may take tramadol every 6 hours as needed for pain.  Please follow-up with your dentist on Monday morning for reevaluation/additional treatment.  Please go to the emergency room ASAP if you develop any worsening symptoms prior to seeing your dentist.  This includes but is not limited to fevers, worsening dental pain, facial swelling, or any new concerns that arise.  Hope you feel better soon!

## 2023-08-27 NOTE — ED Triage Notes (Signed)
 Pt states all of his teet pulled on Wednesday and hasn't been able to swallow. Pt states tried liquid ibuprofen. Pt states he has blisters all across stitches. Pt states he called the dentist and they told him to come here if he's hurting that bad. Pt states he has bee alternating between ibuprofen and tylenol , but it's not helping.

## 2023-10-26 ENCOUNTER — Other Ambulatory Visit: Payer: Self-pay

## 2023-10-26 ENCOUNTER — Ambulatory Visit
Admission: EM | Admit: 2023-10-26 | Discharge: 2023-10-26 | Disposition: A | Discharge status: Home / Self Care | Attending: Nurse Practitioner | Admitting: Nurse Practitioner

## 2023-10-26 DIAGNOSIS — L740 Miliaria rubra: Secondary | ICD-10-CM | POA: Diagnosis not present

## 2023-10-26 MED ORDER — DEXAMETHASONE SODIUM PHOSPHATE 10 MG/ML IJ SOLN
10.0000 mg | Freq: Once | INTRAMUSCULAR | Status: AC
  Administered 2023-10-26: 10 mg via INTRAMUSCULAR

## 2023-10-26 MED ORDER — TRIAMCINOLONE ACETONIDE 0.5 % EX OINT: 1.0000 | TOPICAL_OINTMENT | Freq: Two times a day (BID) | CUTANEOUS | 0 refills | Status: AC

## 2023-10-26 MED ORDER — PREDNISONE 10 MG (21) PO TBPK: ORAL_TABLET | Freq: Every day | ORAL | 0 refills | Status: DC

## 2023-10-26 NOTE — Discharge Instructions (Addendum)
 You have been diagnosed with a heat-related rash consistent with miliaria rubra, which tends to flare during hot and humid weather. This condition can cause redness, itching, and a burning sensation when sweating. To manage your symptoms, continue taking loratadine daily and use Benadryl as needed for additional itching relief. Apply the prescribed triamcinolone  ointment thinly to affected areas twice daily. Complete the prednisone  Dosepak as directed. Use only fragrance-free soaps, lotions, and detergents to avoid further irritation. Keep your skin cool and dry, wear loose, breathable clothing, and avoid prolonged exposure to heat and humidity when possible. If your rash worsens, does not improve with treatment, or if you develop signs of infection such as spreading redness, pain, drainage, or fever, contact your primary care provider promptly or seek care at the emergency department.

## 2023-10-26 NOTE — ED Provider Notes (Signed)
 [facility]    CSN: [id number] Arrival date & time: 10/26/23  0803      History   Chief Complaint No chief complaint on file.   HPI Kyle Frazier is a 53 y.o. male.   Discussed the use of AI scribe software for clinical note transcription with the patient, who gave verbal consent to proceed.   Patient presents with a recurring rash that typically occurs during summer months. The patient reports that the rash started when the weather became humid, coming and going for a couple of weeks. On Sunday night, it turned blood-red in appearance.  The rash is characterized by burning sensation when the patient sweats over it, followed by itching as it dries out. Patient states that this year's occurrence is a little worse compared to previous years. 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 The patient has attempted to manage the symptoms with over-the-counter loratadine, taken once daily, which provided some relief when in cooler environments but became less effective when humidity increased.  Patient denies any new exposures such as soaps, lotions, detergents, or medications. He denies experiencing any fevers. The patient mentions receiving a steroid injection for similar symptoms last year that was helpful.   The following portions of the patient's history were reviewed and updated as appropriate: allergies, current medications, past family history, past medical history, past social history, past surgical history, and problem list.    History reviewed. No pertinent past medical history.  There are no active problems to display for this patient.   Past Surgical History:  Procedure Laterality Date   KNEE CARTILAGE SURGERY         Home Medications    Prior to Admission medications   Medication Sig Start Date End Date Taking? Authorizing Provider  predniSONE  (STERAPRED UNI-PAK 21 TAB)  10 MG (21) TBPK tablet Take by mouth daily. Take 6 tabs by mouth daily  for 2 days, then 5 tabs for 2 days, then 4 tabs for 2 days, then 3 tabs for 2 days, 2 tabs for 2 days, then 1 tab by mouth daily for 2 days 10/26/23  Yes Iola Lukes, FNP  triamcinolone  ointment (KENALOG ) 0.5 % Apply 1 Application topically 2 (two) times daily. Apply thin layer to affected areas twice a day for up to 2 weeks 10/26/23  Yes Jazia Faraci, Lukes, FNP  albuterol  (VENTOLIN  HFA) 108 (90 Base) MCG/ACT inhaler Inhale 1-2 puffs into the lungs every 6 (six) hours as needed for wheezing or shortness of breath. 07/16/22   Mayer, Jodi R, NP  EPINEPHrine  0.3 mg/0.3 mL IJ SOAJ injection IM injection as needed anaphylactic reaction 06/26/19   Saguier, [place] RIGGERS    Family History History reviewed. No pertinent family history.  Social History Social History   Tobacco Use   Smoking status: Every Day    Current packs/day: 2.00    Average packs/day: 2.0 packs/day for 42.5 years (85.1 ttl pk-yrs)    Types: E-cigarettes, Cigarettes    Start date: 1983   Smokeless tobacco: Never  Vaping Use   Vaping status: Some Days  Substance Use Topics   Alcohol use: No   Drug use: Not Currently    Types: Other-see comments    Comment: used some oxycotin he found     Allergies   Bee venom and Penicillins   Review of Systems Review of Systems  Constitutional:  Negative for fever.  Skin:  Positive  for rash.  All other systems reviewed and are negative.    Physical Exam Triage Vital Signs ED Triage Vitals  Encounter Vitals Group     BP 10/26/23 0820 137/85     Girls Systolic BP Percentile --      Girls Diastolic BP Percentile --      Boys Systolic BP Percentile --      Boys Diastolic BP Percentile --      Pulse Rate 10/26/23 0820 73     Resp 10/26/23 0820 17     Temp 10/26/23 0820 97.8 F (36.6 C)     Temp Source 10/26/23 0820 Oral     SpO2 10/26/23 0820 94 %     Weight --      Height --      Head Circumference  --      Peak Flow --      Pain Score 10/26/23 0819 6     Pain Loc --      Pain Education --      Exclude from Growth Chart --    No data found.  Updated Vital Signs BP 137/85   Pulse 73   Temp 97.8 F (36.6 C) (Oral)   Resp 17   SpO2 94%   Visual Acuity Right Eye Distance:   Left Eye Distance:   Bilateral Distance:    Right Eye Near:   Left Eye Near:    Bilateral Near:     Physical Exam Vitals reviewed.  Constitutional:      General: He is awake. He is not in acute distress.    Appearance: Normal appearance. He is well-developed. He is not ill-appearing, toxic-appearing or diaphoretic.  HENT:     Head: Normocephalic.     Right Ear: Hearing normal.     Left Ear: Hearing normal.     Nose: Nose normal.     Mouth/Throat:     Mouth: Mucous membranes are moist.  Eyes:     General: Vision grossly intact.     Conjunctiva/sclera: Conjunctivae normal.  Cardiovascular:     Rate and Rhythm: Normal rate and regular rhythm.     Heart sounds: Normal heart sounds.  Pulmonary:     Effort: Pulmonary effort is normal.     Breath sounds: Normal breath sounds and air entry.  Musculoskeletal:        General: Normal range of motion.     Cervical back: Full passive range of motion without pain, normal range of motion and neck supple.  Skin:    General: Skin is warm and dry.     Findings: Rash present.     Comments: Multiple small, erythematous papules noted over the upper chest, back, and posterior neck. Lesions are non-vesicular, non-exudative, and not blanching (see images below)   Neurological:     General: No focal deficit present.     Mental Status: He is alert and oriented to person, place, and time.  Psychiatric:        Speech: Speech normal.        Behavior: Behavior is cooperative.         UC Treatments / Results  Labs (all labs ordered are listed, but only abnormal results are displayed) Labs Reviewed - No data to display  EKG   Radiology No results  found.  Procedures Procedures (including critical care time)  Medications Ordered in UC Medications  dexamethasone  (DECADRON ) injection 10 mg (has no administration in time range)    Initial Impression /  Assessment and Plan / UC Course  I have reviewed the triage vital signs and the nursing notes.  Pertinent labs & imaging results that were available during my care of the patient were reviewed by me and considered in my medical decision making (see chart for details).     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 The rash has been present for several weeks with intermittent worsening, described as a burning sensation when sweating and increased itching as the skin dries. No recent changes in soaps, detergents, lotions, or medications. No fever or systemic symptoms reported. Symptoms initially improved with daily loratadine but worsened with rising humidity. In clinic, the patient was treated with a Decadron  injection and prescribed a prednisone  Dosepak, triamcinolone  ointment, continuation of daily loratadine, and Benadryl as needed. Advised to use only fragrance-free products until symptoms resolve. Patient should follow up with PCP if the rash does not improve with this regimen or worsens, and seek ED care for signs of infection such as spreading redness, pain, drainage, or fever.  Today's evaluation has revealed no signs of a dangerous process. Discussed diagnosis with patient and/or guardian. Patient and/or guardian aware of their diagnosis, possible red flag symptoms to watch out for and need for close follow up. Patient and/or guardian understands verbal and written discharge instructions. Patient and/or guardian comfortable with plan and disposition.  Patient and/or guardian has a clear mental status at this time, good insight into illness (after discussion and teaching) and  has clear judgment to make decisions regarding their care  Documentation was completed with the aid of voice recognition software. Transcription may contain typographical errors.  Final Clinical Impressions(s) / UC Diagnoses   Final diagnoses:  Miliaria rubra     Discharge Instructions      You have been diagnosed with a heat-related rash consistent with miliaria rubra, which tends to flare during hot and humid weather. This condition can cause redness, itching, and a burning sensation when sweating. To manage your symptoms, continue taking loratadine daily and use Benadryl as needed for additional itching relief. Apply the prescribed triamcinolone  ointment thinly to affected areas twice daily. Complete the prednisone  Dosepak as directed. Use only fragrance-free soaps, lotions, and detergents to avoid further irritation. Keep your skin cool and dry, wear loose, breathable clothing, and avoid prolonged exposure to heat and humidity when possible. If your rash worsens, does not improve with treatment, or if you develop signs of infection such as spreading redness, pain, drainage, or fever, contact your primary care provider promptly or seek care at the emergency department.      ED Prescriptions     Medication Sig Dispense Auth. Provider   predniSONE  (STERAPRED UNI-PAK 21 TAB) 10 MG (21) TBPK tablet Take by mouth daily. Take 6 tabs by mouth daily  for 2 days, then 5 tabs for 2 days, then 4 tabs for 2 days, then 3 tabs for 2 days, 2 tabs for 2 days, then 1 tab by mouth daily for 2 days 42 tablet Ferrin Liebig, [place], FNP   triamcinolone  ointment (KENALOG ) 0.5 % Apply 1 Application topically 2 (two) times daily. Apply thin layer to affected areas twice a day for up to 2 weeks 45 g Iola Lukes, FNP      PDMP not reviewed this encounter.   Iola [place], [place] 10/26/23 [phone]

## 2023-10-26 NOTE — ED Triage Notes (Signed)
 Pt c/o rash on chest left flank area and on entire backx1wk. Pt denies new meds, cleaning products or hygiene products. Pt states he gets this rash every summer. Pt states has been taking all day allergy relief, but it hasn't been helping. Pt has what looks like a heat rash in those area.

## 2023-11-04 ENCOUNTER — Ambulatory Visit
Admission: EM | Admit: 2023-11-04 | Discharge: 2023-11-04 | Disposition: A | Discharge status: Home / Self Care | Attending: Family Medicine | Admitting: Family Medicine

## 2023-11-04 DIAGNOSIS — B356 Tinea cruris: Secondary | ICD-10-CM | POA: Diagnosis not present

## 2023-11-04 MED ORDER — KETOCONAZOLE 2 % EX CREA
1.0000 | TOPICAL_CREAM | Freq: Every day | CUTANEOUS | 0 refills | Status: AC
Start: 2023-11-04 — End: ?

## 2023-11-04 NOTE — Discharge Instructions (Signed)
 Start ketoconazole topical antifungal cream to the groin area daily.  Keep the area clean and dry as much as possible.  Please follow-up with dermatology for further evaluation and treatment.  Please go to the ER for any worsening symptoms.  Hope you feel better soon!

## 2023-11-04 NOTE — ED Triage Notes (Signed)
 Pt present with a rash x four weeks. Pt states he finished taking medication for the rash and now has the same rash on the rt side above his leg and on the lt side of his back.

## 2023-11-04 NOTE — ED Provider Notes (Signed)
 [facility]    CSN: [id number] Arrival date & time: 11/04/23  1503      History   Chief Complaint Chief Complaint  Patient presents with   Rash    HPI Kyle Frazier is a 53 y.o. male presents for rash.  Patient reports over the past few days he has been having a painful burning rash in his bilateral groin area.  He denies any drainage swelling fevers or chills.  He does work in an incredibly hot environment states sometimes he gets up 135 degrees when he sweats profusely.  He was seen in urgent care on July 16 for a different rash on his back and chest.  He was prescribed a prednisone  taper with topical triamcinolone .  He states that rash is resolved but this is a different rash.  He has been trying to contact dermatology but he states the appointments are several months out.  He has been doing the triamcinolone  to the groin area and states it is not helping.  No other concerns at this time.   Rash   History reviewed. No pertinent past medical history.  There are no active problems to display for this patient.   Past Surgical History:  Procedure Laterality Date   KNEE CARTILAGE SURGERY         Home Medications    Prior to Admission medications   Medication Sig Start Date End Date Taking? Authorizing Provider  ketoconazole (NIZORAL) 2 % cream Apply 1 Application topically daily. 11/04/23  Yes Zykerria Tanton, Jodi R, NP  albuterol  (VENTOLIN  HFA) 108 (90 Base) MCG/ACT inhaler Inhale 1-2 puffs into the lungs every 6 (six) hours as needed for wheezing or shortness of breath. 07/16/22   Tynisha Ogan, Jodi R, NP  EPINEPHrine  0.3 mg/0.3 mL IJ SOAJ injection IM injection as needed anaphylactic reaction 06/26/19   Saguier, [place], PA-C  predniSONE  (STERAPRED UNI-PAK 21 TAB) 10 MG (21) TBPK tablet Take by mouth daily. Take 6 tabs by mouth daily  for 2 days, then 5 tabs for 2 days, then 4 tabs for 2 days, then 3 tabs for 2 days, 2 tabs for 2 days, then 1 tab by mouth daily for 2 days 10/26/23    Iola Lukes, FNP  triamcinolone  ointment (KENALOG ) 0.5 % Apply 1 Application topically 2 (two) times daily. Apply thin layer to affected areas twice a day for up to 2 weeks 10/26/23   Iola Lukes, FNP    Family History History reviewed. No pertinent family history.  Social History Social History   Tobacco Use   Smoking status: Every Day    Current packs/day: 2.00    Average packs/day: 2.0 packs/day for 42.6 years (85.1 ttl pk-yrs)    Types: E-cigarettes, Cigarettes    Start date: 1983   Smokeless tobacco: Never  Vaping Use   Vaping status: Some Days  Substance Use Topics   Alcohol use: No   Drug use: Not Currently    Types: Other-see comments    Comment: used some oxycotin he found     Allergies   Bee venom and Penicillins   Review of Systems Review of Systems  Skin:  Positive for rash.     Physical Exam Triage Vital Signs ED Triage Vitals  Encounter Vitals Group     BP 11/04/23 1519 (!) 125/92     Girls Systolic BP Percentile --      Girls Diastolic BP Percentile --      Boys Systolic BP Percentile --  Boys Diastolic BP Percentile --      Pulse Rate 11/04/23 1519 80     Resp 11/04/23 1519 16     Temp 11/04/23 1519 98.3 F (36.8 C)     Temp Source 11/04/23 1519 Oral     SpO2 11/04/23 1519 93 %     Weight --      Height --      Head Circumference --      Peak Flow --      Pain Score 11/04/23 1518 0     Pain Loc --      Pain Education --      Exclude from Growth Chart --    No data found.  Updated Vital Signs BP (!) 125/92 (BP Location: Left Arm)   Pulse 80   Temp 98.3 F (36.8 C) (Oral)   Resp 16   SpO2 93%   Visual Acuity Right Eye Distance:   Left Eye Distance:   Bilateral Distance:    Right Eye Near:   Left Eye Near:    Bilateral Near:     Physical Exam Vitals and nursing note reviewed.  Constitutional:      Appearance: Normal appearance.  HENT:     Head: Normocephalic and atraumatic.  Eyes:     Pupils: Pupils are  equal, round, and reactive to light.  Cardiovascular:     Rate and Rhythm: Normal rate.  Pulmonary:     Effort: Pulmonary effort is normal.  Skin:    General: Skin is warm and dry.         Comments: Large erythematous plaques with central clearing and raised edges to bilateral groins.  There is no drainage or swelling.  Neurological:     General: No focal deficit present.     Mental Status: He is alert and oriented to person, place, and time.  Psychiatric:        Mood and Affect: Mood normal.        Behavior: Behavior normal.      UC Treatments / Results  Labs (all labs ordered are listed, but only abnormal results are displayed) Labs Reviewed - No data to display  EKG   Radiology No results found.  Procedures Procedures (including critical care time)  Medications Ordered in UC Medications - No data to display  Initial Impression / Assessment and Plan / UC Course  I have reviewed the triage vital signs and the nursing notes.  Pertinent labs & imaging results that were available during my care of the patient were reviewed by me and considered in my medical decision making (see chart for details).     Reviewed exam and symptoms with patient.  No red flags.  Discussed tinea curious, start ketoconazole topically.  Advised to keep areas clean and dry as possible.  Contact information for dermatology provided for patient to make a follow-up appointment.  ER precautions reviewed and patient verbalized understanding Final Clinical Impressions(s) / UC Diagnoses   Final diagnoses:  Tinea cruris     Discharge Instructions      Start ketoconazole topical antifungal cream to the groin area daily.  Keep the area clean and dry as much as possible.  Please follow-up with dermatology for further evaluation and treatment.  Please go to the ER for any worsening symptoms.  Hope you feel better soon!    ED Prescriptions     Medication Sig Dispense Auth. Provider   ketoconazole  (NIZORAL) 2 % cream Apply 1 Application topically  daily. 60 g Keshara Kiger, Jodi R, NP      PDMP not reviewed this encounter.   Loreda Myla SAUNDERS, NP 11/04/23 [phone]

## 2023-11-11 ENCOUNTER — Emergency Department (HOSPITAL_BASED_OUTPATIENT_CLINIC_OR_DEPARTMENT_OTHER)
Admission: EM | Admit: 2023-11-11 | Discharge: 2023-11-11 | Disposition: A | Discharge status: Home / Self Care | Payer: Worker's Compensation | Attending: Emergency Medicine | Admitting: Emergency Medicine

## 2023-11-11 ENCOUNTER — Other Ambulatory Visit: Payer: Self-pay

## 2023-11-11 ENCOUNTER — Encounter (HOSPITAL_BASED_OUTPATIENT_CLINIC_OR_DEPARTMENT_OTHER): Payer: Self-pay

## 2023-11-11 DIAGNOSIS — M25561 Pain in right knee: Secondary | ICD-10-CM | POA: Diagnosis present

## 2023-11-11 DIAGNOSIS — R030 Elevated blood-pressure reading, without diagnosis of hypertension: Secondary | ICD-10-CM | POA: Insufficient documentation

## 2023-11-11 MED ORDER — KETOROLAC TROMETHAMINE 15 MG/ML IJ SOLN
15.0000 mg | Freq: Once | INTRAMUSCULAR | Status: AC
  Administered 2023-11-11: 15 mg via INTRAMUSCULAR
  Filled 2023-11-11: qty 1

## 2023-11-11 NOTE — ED Triage Notes (Signed)
 Pt states while at work last night he was on conveyor belt when someone hit forward causing his right knee to be tweaked. Pt states he had sx in Feb. 2025 on same knee. During the night pt states knee has swollen, is tender & painful. Reports difficulty putting weight on right knee.

## 2023-11-11 NOTE — Discharge Instructions (Addendum)
 Your evaluated in the emergency room for right knee pain.  Please wear the knee brace whenever ambulating.  You may alternate Tylenol  and ibuprofen as needed for pain.  Please follow-up with your surgeon.  Your blood pressure was noted to be elevated here in the ED.  Please follow with your primary care doctor for further evaluation.

## 2023-11-11 NOTE — ED Provider Notes (Signed)
 [facility] Provider Note   CSN: [id number] Arrival date & time: 11/11/23  2033     Patient presents with: Knee Injury   Kyle Frazier is a 53 y.o. male status post right meniscal repair 02/25 presents with complaints of right knee pain.  Patient states he twisted his knee at work.  Reports difficulty ambulating due to the pain.  States it feels like when he first tore his meniscus.   HPI    History reviewed. No pertinent past medical history. Past Surgical History:  Procedure Laterality Date   KNEE CARTILAGE SURGERY      Prior to Admission medications   Medication Sig Start Date End Date Taking? Authorizing Provider  albuterol  (VENTOLIN  HFA) 108 (90 Base) MCG/ACT inhaler Inhale 1-2 puffs into the lungs every 6 (six) hours as needed for wheezing or shortness of breath. 07/16/22   Mayer, Jodi R, NP  EPINEPHrine  0.3 mg/0.3 mL IJ SOAJ injection IM injection as needed anaphylactic reaction 06/26/19   Saguier, [place], PA-C  ketoconazole  (NIZORAL ) 2 % cream Apply 1 Application topically daily. 11/04/23   Mayer, Jodi R, NP  predniSONE  (STERAPRED UNI-PAK 21 TAB) 10 MG (21) TBPK tablet Take by mouth daily. Take 6 tabs by mouth daily  for 2 days, then 5 tabs for 2 days, then 4 tabs for 2 days, then 3 tabs for 2 days, 2 tabs for 2 days, then 1 tab by mouth daily for 2 days 10/26/23   Iola Lukes, FNP  triamcinolone  ointment (KENALOG ) 0.5 % Apply 1 Application topically 2 (two) times daily. Apply thin layer to affected areas twice a day for up to 2 weeks 10/26/23   Iola Lukes, FNP    Allergies: Bee venom and Penicillins    Review of Systems  Musculoskeletal:  Positive for myalgias.    Updated Vital Signs BP (!) 162/107 (BP Location: Right Arm)   Pulse 100   Temp 98.3 F (36.8 C) (Oral)   Resp 20   Ht 5' 8 (1.727 m)   Wt 122.5 kg   SpO2 98%   BMI 41.05 kg/m   Physical Exam Constitutional:      Appearance: Normal appearance.   HENT:     Head: Normocephalic and atraumatic.  Eyes:     Conjunctiva/sclera: Conjunctivae normal.  Cardiovascular:     Pulses: Normal pulses.  Pulmonary:     Effort: Respiratory distress present.  Musculoskeletal:        General: Swelling present.     Comments: Right knee with mild swelling, tenderness to the medial joint line, range of motion is limited due to pain, no erythema or warmth, no ecchymosis, knee is stable to varus and valgus, negative Lachman's, PT pulses 2+, compartments are soft  Neurological:     Mental Status: He is alert.     (all labs ordered are listed, but only abnormal results are displayed) Labs Reviewed - No data to display  EKG: None  Radiology: No results found.   Procedures   Medications Ordered in the ED  ketorolac  (TORADOL ) 15 MG/ML injection 15 mg (has no administration in time range)                                    Medical Decision Making  This patient presents to the ED with chief complaint(s) of knee pain.  The complaint involves an extensive differential diagnosis and also  carries with it a high risk of complications and morbidity.   Pertinent past medical history as listed in HPI  The differential diagnosis includes  Fracture, dislocation, sprain Additional history obtained: Records reviewed Care Everywhere/External Records  Assessment and management:   Hemodynamically stable patient presented with complaints of right knee injury.  Patient states he twisted his knee at work.  He is several months status post right knee meniscal repair.  He has tenderness to the medial joint line with generalized swelling noted to the knee without ecchymosis.  His knee is stable to varus and valgus.  Compartments are soft, PT pulses 2+.  Offered x-Kyle.  He is not interested at this time.  Will give a shot of Toradol  and place a knee brace and have him follow-up with his surgeon.  Noted to be hypertensive here.  He is asymptomatic.  Will have him  follow with his PCP for further evaluation.  Independent ECG interpretation:  none  Independent labs interpretation:  The following labs were independently interpreted:  none  Independent visualization and interpretation of imaging: I independently visualized the following imaging with scope of interpretation limited to determining acute life threatening conditions related to emergency care: none    Consultations obtained:   none  Disposition:   Patient will be discharged home. The patient has been appropriately medically screened and/or stabilized in the ED. I have low suspicion for any other emergent medical condition which would require further screening, evaluation or treatment in the ED or require inpatient management. At time of discharge the patient is hemodynamically stable and in no acute distress. I have discussed work-up results and diagnosis with patient and answered all questions. Patient is agreeable with discharge plan. We discussed strict return precautions for returning to the emergency department and they verbalized understanding.     Social Determinants of Health:   none  This note was dictated with voice recognition software.  Despite best efforts at proofreading, errors may have occurred which can change the documentation meaning.       Final diagnoses:  Acute pain of right knee    ED Discharge Orders     None          Donnajean Lynwood VEAR DEVONNA 11/11/23 2111    Dreama Longs, MD 11/12/23 [phone]

## 2023-11-15 ENCOUNTER — Emergency Department (HOSPITAL_BASED_OUTPATIENT_CLINIC_OR_DEPARTMENT_OTHER)

## 2023-11-15 ENCOUNTER — Other Ambulatory Visit (HOSPITAL_BASED_OUTPATIENT_CLINIC_OR_DEPARTMENT_OTHER): Payer: Self-pay

## 2023-11-15 ENCOUNTER — Encounter (HOSPITAL_BASED_OUTPATIENT_CLINIC_OR_DEPARTMENT_OTHER): Payer: Self-pay | Admitting: Emergency Medicine

## 2023-11-15 ENCOUNTER — Emergency Department (HOSPITAL_BASED_OUTPATIENT_CLINIC_OR_DEPARTMENT_OTHER)
Admission: EM | Admit: 2023-11-15 | Discharge: 2023-11-15 | Disposition: A | Discharge status: Home / Self Care | Payer: Worker's Compensation | Attending: Emergency Medicine | Admitting: Emergency Medicine

## 2023-11-15 ENCOUNTER — Other Ambulatory Visit: Payer: Self-pay

## 2023-11-15 DIAGNOSIS — X501XXA Overexertion from prolonged static or awkward postures, initial encounter: Secondary | ICD-10-CM | POA: Diagnosis not present

## 2023-11-15 DIAGNOSIS — M25561 Pain in right knee: Secondary | ICD-10-CM | POA: Diagnosis present

## 2023-11-15 DIAGNOSIS — Y990 Civilian activity done for income or pay: Secondary | ICD-10-CM | POA: Diagnosis not present

## 2023-11-15 MED ORDER — OXYCODONE-ACETAMINOPHEN 5-325 MG PO TABS
1.0000 | ORAL_TABLET | Freq: Four times a day (QID) | ORAL | 0 refills | Status: AC | PRN
  Filled 2023-11-15: qty 10, 3d supply, fill #0

## 2023-11-15 MED ORDER — KETOROLAC TROMETHAMINE 15 MG/ML IJ SOLN
15.0000 mg | Freq: Once | INTRAMUSCULAR | Status: DC
  Filled 2023-11-15: qty 1

## 2023-11-15 MED ORDER — KETOROLAC TROMETHAMINE 15 MG/ML IJ SOLN
15.0000 mg | Freq: Once | INTRAMUSCULAR | Status: AC
  Administered 2023-11-15: 15 mg via INTRAMUSCULAR

## 2023-11-15 NOTE — ED Provider Notes (Signed)
 [facility] Provider Note   CSN: [id number] Arrival date & time: 11/15/23  1514     Patient presents with: Knee Pain   Kyle Frazier is a 53 y.o. male.   78 male with a past medical history of meniscus tear presents to the ED with a chief complaint of right knee pain status post injury.  Patient reports on Friday he was hit by a compression belt on the outside part of his knee, causing this to twist.  He endorses worsening pain to the medial aspect especially with any type of rotation and movement.  Reports that when he is walking around he feels shifting along the medial aspect.  Previously operated for a meniscus tear by Dr. Liam of orthopedics.  Is been alternating ibuprofen versus Tylenol  to help with pain control without much improvement in symptoms.  No other complaints reported.  The history is provided by the patient.  Knee Pain Associated symptoms: no fever        Prior to Admission medications   Medication Sig Start Date End Date Taking? Authorizing Provider  oxyCODONE -acetaminophen  (PERCOCET/ROXICET) 5-325 MG tablet Take 1 tablet by mouth every 6 (six) hours as needed for severe pain (pain score 7-10). 11/15/23  Yes Towana Ozell BROCKS, MD  albuterol  (VENTOLIN  HFA) 108 (90 Base) MCG/ACT inhaler Inhale 1-2 puffs into the lungs every 6 (six) hours as needed for wheezing or shortness of breath. 07/16/22   Mayer, Jodi R, NP  EPINEPHrine  0.3 mg/0.3 mL IJ SOAJ injection IM injection as needed anaphylactic reaction 06/26/19   Saguier, [place], PA-C  ketoconazole  (NIZORAL ) 2 % cream Apply 1 Application topically daily. 11/04/23   Mayer, Jodi R, NP  predniSONE  (STERAPRED UNI-PAK 21 TAB) 10 MG (21) TBPK tablet Take by mouth daily. Take 6 tabs by mouth daily  for 2 days, then 5 tabs for 2 days, then 4 tabs for 2 days, then 3 tabs for 2 days, 2 tabs for 2 days, then 1 tab by mouth daily for 2 days 10/26/23   Iola Lukes, FNP  triamcinolone  ointment  (KENALOG ) 0.5 % Apply 1 Application topically 2 (two) times daily. Apply thin layer to affected areas twice a day for up to 2 weeks 10/26/23   Iola Lukes, FNP    Allergies: Bee venom and Penicillins    Review of Systems  Constitutional:  Negative for fever.  Musculoskeletal:  Positive for arthralgias.    Updated Vital Signs BP (!) 163/94 (BP Location: Right Arm)   Pulse 71   Temp 98.2 F (36.8 C)   Resp 18   Ht 5' 8 (1.727 m)   Wt 122.5 kg   SpO2 99%   BMI 41.05 kg/m   Physical Exam Vitals and nursing note reviewed.  Constitutional:      Appearance: Normal appearance.  HENT:     Head: Normocephalic and atraumatic.     Mouth/Throat:     Mouth: Mucous membranes are moist.  Cardiovascular:     Rate and Rhythm: Normal rate.  Pulmonary:     Effort: Pulmonary effort is normal.  Abdominal:     General: Abdomen is flat.  Musculoskeletal:        General: Swelling and tenderness present.     Cervical back: Normal range of motion and neck supple.     Comments: Right knee appears swollen, brace in place, this was removed tenderness to palpation along the medial joint line along with patellar tendon region.  Skin:  General: Skin is warm and dry.  Neurological:     Mental Status: He is alert and oriented to person, place, and time.     (all labs ordered are listed, but only abnormal results are displayed) Labs Reviewed - No data to display  EKG: None  Radiology: DG Knee Complete 4 Views Right Result Date: 11/15/2023 CLINICAL DATA:  RIGHT knee twisting injury EXAM: RIGHT KNEE - COMPLETE 4+ VIEW COMPARISON:  04/01/2023 FINDINGS: No fracture or dislocation. Mild degenerative changes of the medial and lateral compartments. Visualized soft tissues are normal. IMPRESSION: No acute abnormality of the RIGHT knee. Electronically Signed   By: Aliene Lloyd M.D.   On: 11/15/2023 16:57     Procedures   Medications Ordered in the ED  ketorolac  (TORADOL ) 15 MG/ML injection 15  mg (has no administration in time range)                                    Medical Decision Making Amount and/or Complexity of Data Reviewed Radiology: ordered.    Patient presented to the ED with a chief complaint of right knee pain status post injury which occurred approximately 4 days ago.  Reports he was struck with a conveyor belt, felt that his knee had popped.  He feels like this is likely shifting around.  Whenever he is ambulating.  On evaluation there is significant pain to palpation along the medial aspect of his right knee.  Antalgic gait with brace in place.  X-Kyle did not show any acute findings at this time.  We discussed supportive treatment.  Seeing as patient had intervention by Dr. Liam in February of this year, suspect likely reinjuring his meniscus.  Will follow-up with him on an outpatient basis.  Patient is hemodynamically stable for discharge.   Portions of this note were generated with Scientist, clinical (histocompatibility and immunogenetics). Dictation errors may occur despite best attempts at proofreading.      Final diagnoses:  Acute pain of right knee    ED Discharge Orders          Ordered    oxyCODONE -acetaminophen  (PERCOCET/ROXICET) 5-325 MG tablet  Every 6 hours PRN        11/15/23 1714               Ka Bench, PA-C 11/15/23 1718    Towana Ozell BROCKS, MD 11/16/23 [phone]

## 2023-11-15 NOTE — Discharge Instructions (Addendum)
 The x-ray of your knee did not show any acute findings on today's visit.  You were given a short prescription for anti-inflammatories along with narcotics to help with pain control.  Please be aware narcotics are sedating.  Do not drink or drive while taking this medication.

## 2023-11-15 NOTE — ED Triage Notes (Signed)
 Pt reports standing on conveyor belt on Friday night when R knee got twisted. Reports it feels as if it feels like his knee is popping in and out of socket when walking.   Took advil and tylenol  at 1000 today.

## 2024-02-13 ENCOUNTER — Ambulatory Visit
Admission: EM | Admit: 2024-02-13 | Discharge: 2024-02-13 | Disposition: A | Discharge status: Home / Self Care | Attending: Family Medicine | Admitting: Family Medicine

## 2024-02-13 DIAGNOSIS — J209 Acute bronchitis, unspecified: Secondary | ICD-10-CM

## 2024-02-13 DIAGNOSIS — I10 Essential (primary) hypertension: Secondary | ICD-10-CM

## 2024-02-13 DIAGNOSIS — J329 Chronic sinusitis, unspecified: Secondary | ICD-10-CM | POA: Diagnosis not present

## 2024-02-13 DIAGNOSIS — F17200 Nicotine dependence, unspecified, uncomplicated: Secondary | ICD-10-CM

## 2024-02-13 MED ORDER — ALBUTEROL SULFATE HFA 108 (90 BASE) MCG/ACT IN AERS: 1.0000 | INHALATION_SPRAY | Freq: Four times a day (QID) | RESPIRATORY_TRACT | 0 refills | Status: DC | PRN

## 2024-02-13 MED ORDER — HYDROCHLOROTHIAZIDE 12.5 MG PO TABS: ORAL_TABLET | ORAL | 0 refills | Status: AC

## 2024-02-13 MED ORDER — PROMETHAZINE-DM 6.25-15 MG/5ML PO SYRP: 5.0000 mL | ORAL_SOLUTION | Freq: Three times a day (TID) | ORAL | 0 refills | Status: DC | PRN

## 2024-02-13 MED ORDER — PREDNISONE 20 MG PO TABS: ORAL_TABLET | ORAL | 0 refills | Status: DC

## 2024-02-13 NOTE — ED Provider Notes (Signed)
 [facility]  Note:  This document was prepared using Conservation officer, historic buildings and may include unintentional dictation errors.  MRN: [id number] DOB: 10-16-1970  Subjective:   Kyle Frazier is a 53 y.o. male presenting for 2 day history of wheezing, sinus congestion, productive cough. No fever, chest pain, ear pain. Has COPD, is out of his albuterol  inhaler. Smokes 1.5ppd. Has a history of elevated blood pressure readings but has never been diagnosed with high blood pressure or taking blood pressure medication.  Patient does not have a PCP.  No current facility-administered medications for this encounter.  Current Outpatient Medications:    albuterol  (VENTOLIN  HFA) 108 (90 Base) MCG/ACT inhaler, Inhale 1-2 puffs into the lungs every 6 (six) hours as needed for wheezing or shortness of breath., Disp: 1 each, Rfl: 0   EPINEPHrine  0.3 mg/0.3 mL IJ SOAJ injection, IM injection as needed anaphylactic reaction, Disp: 2 each, Rfl: 0   ketoconazole  (NIZORAL ) 2 % cream, Apply 1 Application topically daily., Disp: 60 g, Rfl: 0   oxyCODONE -acetaminophen  (PERCOCET/ROXICET) 5-325 MG tablet, Take 1 tablet by mouth every 6 (six) hours as needed for severe pain (pain score 7-10)., Disp: 10 tablet, Rfl: 0   predniSONE  (STERAPRED UNI-PAK 21 TAB) 10 MG (21) TBPK tablet, Take by mouth daily. Take 6 tabs by mouth daily  for 2 days, then 5 tabs for 2 days, then 4 tabs for 2 days, then 3 tabs for 2 days, 2 tabs for 2 days, then 1 tab by mouth daily for 2 days, Disp: 42 tablet, Rfl: 0   triamcinolone  ointment (KENALOG ) 0.5 %, Apply 1 Application topically 2 (two) times daily. Apply thin layer to affected areas twice a day for up to 2 weeks, Disp: 45 g, Rfl: 0   Allergies  Allergen Reactions   Bee Venom Anaphylaxis   Penicillins Swelling    Has patient had a PCN reaction causing immediate rash, facial/tongue/throat swelling, SOB or lightheadedness with hypotension: YES Has patient  had a PCN reaction causing severe rash involving mucus membranes or skin necrosis: NO Has patient had a PCN reaction that required hospitalization. NO Has patient had a PCN reaction occurring within the last 10 years: NO If all of the above answers are NO, then may proceed with Cephalosporin use.    History reviewed. No pertinent past medical history.   Past Surgical History:  Procedure Laterality Date   KNEE CARTILAGE SURGERY      History reviewed. No pertinent family history.  Social History   Tobacco Use   Smoking status: Every Day    Current packs/day: 2.00    Average packs/day: 2.0 packs/day for 42.8 years (85.7 ttl pk-yrs)    Types: E-cigarettes, Cigarettes    Start date: 1983   Smokeless tobacco: Never  Vaping Use   Vaping status: Some Days  Substance Use Topics   Alcohol use: No   Drug use: Not Currently    Types: Other-see comments    Comment: used some oxycotin he found    ROS   Objective:   Vitals: BP (!) 177/79 (BP Location: Right Arm)   Pulse 93   Temp 98.7 F (37.1 C) (Oral)   Resp 20   SpO2 94%   BP Readings from Last 3 Encounters:  02/13/24 (!) 177/79  11/15/23 (!) 163/94  11/11/23 (!) 162/107   Physical Exam Constitutional:      General: He is not in acute distress.    Appearance: Normal appearance. He is  well-developed and normal weight. He is not ill-appearing, toxic-appearing or diaphoretic.  HENT:     Head: Normocephalic and atraumatic.     Right Ear: Tympanic membrane, ear canal and external ear normal. No drainage, swelling or tenderness. No middle ear effusion. There is no impacted cerumen. Tympanic membrane is not erythematous or bulging.     Left Ear: Tympanic membrane, ear canal and external ear normal. No drainage, swelling or tenderness.  No middle ear effusion. There is no impacted cerumen. Tympanic membrane is not erythematous or bulging.     Nose: Nose normal. No congestion or rhinorrhea.     Mouth/Throat:     Mouth:  Mucous membranes are moist.     Pharynx: No oropharyngeal exudate or posterior oropharyngeal erythema.  Eyes:     General: No scleral icterus.       Right eye: No discharge.        Left eye: No discharge.     Extraocular Movements: Extraocular movements intact.     Conjunctiva/sclera: Conjunctivae normal.  Cardiovascular:     Rate and Rhythm: Normal rate and regular rhythm.     Heart sounds: Normal heart sounds. No murmur heard.    No friction rub. No gallop.  Pulmonary:     Effort: Pulmonary effort is normal. No respiratory distress.     Breath sounds: No stridor. Rhonchi (trace over mid-bibasilar fields) present. No wheezing or rales.  Musculoskeletal:     Cervical back: Normal range of motion and neck supple. No rigidity. No muscular tenderness.  Neurological:     General: No focal deficit present.     Mental Status: He is alert and oriented to person, place, and time.     Cranial Nerves: No cranial nerve deficit.     Motor: No weakness.     Coordination: Coordination normal.     Gait: Gait normal.  Psychiatric:        Mood and Affect: Mood normal.        Behavior: Behavior normal.        Thought Content: Thought content normal.        Judgment: Judgment normal.     Assessment and Plan :   PDMP not reviewed this encounter.  1. Sinobronchitis   2. Smoker   3. Essential hypertension   4. Acute bronchitis, unspecified organism    Will manage sinobronchitis with prednisone , supportive care.  Refilled his albuterol .  Does not have a COPD exacerbation at this stage.  Regarding his high blood pressure, recommended starting hydrochlorothiazide and titrating to a dose of 25 mg once daily.  Emphasized need to follow-up with PCP as soon as possible.  Practice hypertensive friendly diet.  No signs of hypertensive emergency.  Counseled patient on potential for adverse effects with medications prescribed/recommended today, ER and return-to-clinic precautions discussed, patient  verbalized understanding.    Christopher Savannah, [place] 02/13/24 [phone]

## 2024-02-13 NOTE — Discharge Instructions (Addendum)
 Start prednisone , albuterol  for your sinobronchitis infection. Use cough syrup as needed.   For your blood pressure, start taking hydrochlorothiazide 1 tablet daily for 1 week. Then starting next week take 2 tablets daily.  Follow up with a new PCP for a recheck and more management of your blood pressure.   For diabetes or elevated blood sugar, please make sure you are limiting and avoiding starchy, carbohydrate foods like pasta, breads, sweet breads, pastry, rice, potatoes, desserts. These foods can elevate your blood sugar. Also, limit and avoid drinks that contain a lot of sugar such as sodas, sweet teas, fruit juices.  Drinking plain water will be much more helpful, try 64 ounces of water daily.  It is okay to flavor your water naturally by cutting cucumber, lemon, mint or lime, placing it in a picture with water and drinking it over a period of 24-48 hours as long as it remains refrigerated.  For elevated blood pressure, make sure you are monitoring salt in your diet.  Do not eat restaurant foods and limit processed foods at home. I highly recommend you prepare and cook your own foods at home.  Processed foods include things like frozen meals, pre-seasoned meats and dinners, deli meats, canned foods as these foods contain a high amount of sodium/salt.  Make sure you are paying attention to sodium labels on foods you buy at the grocery store. Buy your spices separately such as garlic powder, onion powder, cumin, cayenne, parsley flakes so that you can avoid seasonings that contain salt. However, salt-free seasonings are available and can be used, an example is Mrs. Dash and includes a lot of different mixtures that do not contain salt.  Lastly, when cooking using oils that are healthier for you is important. This includes olive oil, avocado oil, canola oil. We have discussed a lot of foods to avoid but below is a list of foods that can be very healthy to use in your diet whether it is for diabetes,  cholesterol, high blood pressure, or in general healthy eating.  Salads - kale, spinach, cabbage, spring mix, arugula Fruits - avocadoes, berries (blueberries, raspberries, blackberries), apples, oranges, pomegranate, grapefruit, kiwi Vegetables - asparagus, cauliflower, broccoli, green beans, brussel sprouts, bell peppers, beets; stay away from or limit starchy vegetables like potatoes, carrots, peas Other general foods - kidney beans, egg whites, almonds, walnuts, sunflower seeds, pumpkin seeds, fat free yogurt, almond milk, flax seeds, quinoa, oats  Meat - It is better to eat lean meats and limit your red meat including pork to once a week.  Wild caught fish, chicken breast are good options as they tend to be leaner sources of good protein. Still be mindful of the sodium labels for the meats you buy.  DO NOT EAT ANY FOODS ON THIS LIST THAT YOU ARE ALLERGIC TO. For more specific needs, I highly recommend consulting a dietician or nutritionist but this can definitely be a good starting point.

## 2024-02-13 NOTE — ED Triage Notes (Signed)
 Pt reports headache, sinus pressure, cough x 2 days. Sudafed and Nyquil gives no relief.

## 2024-02-23 ENCOUNTER — Ambulatory Visit (HOSPITAL_COMMUNITY)
Admission: RE | Admit: 2024-02-23 | Discharge: 2024-02-23 | Disposition: A | Discharge status: Home / Self Care | Payer: Worker's Compensation | Source: Ambulatory Visit | Attending: Vascular Surgery | Admitting: Vascular Surgery

## 2024-02-23 ENCOUNTER — Other Ambulatory Visit (HOSPITAL_COMMUNITY): Payer: Self-pay | Admitting: Orthopedic Surgery

## 2024-02-23 DIAGNOSIS — R609 Edema, unspecified: Secondary | ICD-10-CM

## 2024-03-25 ENCOUNTER — Ambulatory Visit
Admission: EM | Admit: 2024-03-25 | Discharge: 2024-03-25 | Disposition: A | Discharge status: Home / Self Care | Attending: Family Medicine | Admitting: Family Medicine

## 2024-03-25 DIAGNOSIS — J441 Chronic obstructive pulmonary disease with (acute) exacerbation: Secondary | ICD-10-CM

## 2024-03-25 MED ORDER — IPRATROPIUM-ALBUTEROL 0.5-2.5 (3) MG/3ML IN SOLN
3.0000 mL | Freq: Once | RESPIRATORY_TRACT | Status: AC
  Administered 2024-03-25: 3 mL via RESPIRATORY_TRACT

## 2024-03-25 MED ORDER — PROMETHAZINE-DM 6.25-15 MG/5ML PO SYRP: 5.0000 mL | ORAL_SOLUTION | Freq: Three times a day (TID) | ORAL | 0 refills | Status: AC | PRN

## 2024-03-25 MED ORDER — PREDNISONE 50 MG PO TABS: ORAL_TABLET | ORAL | 0 refills | Status: DC

## 2024-03-25 MED ORDER — ALBUTEROL SULFATE HFA 108 (90 BASE) MCG/ACT IN AERS: 1.0000 | INHALATION_SPRAY | Freq: Four times a day (QID) | RESPIRATORY_TRACT | 0 refills | Status: AC | PRN

## 2024-03-25 MED ORDER — AZITHROMYCIN 250 MG PO TABS: 250.0000 mg | ORAL_TABLET | Freq: Every day | ORAL | 0 refills | Status: DC

## 2024-03-25 NOTE — Discharge Instructions (Addendum)
 I refilled your albuterol  inhaler.  Start azithromycin  antibiotic as prescribed.  Also start prednisone  daily for 5 days.  You may do Promethazine  DM as needed for your cough.  Please note this medication will make you drowsy.  No alcohol or driving while on this medication.  Lots of rest and fluids.  Follow-up with your PCP in 2 days for recheck and go to the ER ASAP if you develop any worsening symptoms.  Hope you feel better soon!

## 2024-03-25 NOTE — ED Triage Notes (Signed)
 Pt present with c/o cough and SOB. Pt states he was given abx and steroids. States he was feeling better and then the symptoms came back and worse.

## 2024-03-25 NOTE — ED Provider Notes (Signed)
 [facility]    CSN: [id number] Arrival date & time: 03/25/24  1342      History   Chief Complaint Chief Complaint  Patient presents with   Shortness of Breath   Cough    HPI Kyle Frazier is a 53 y.o. male  presents for evaluation of URI symptoms for 7 days. Patient reports associated symptoms of productive cough with shortness of breath and fatigue. Denies N/V/D, ears, sore throat, ear pain, body aches.  Patient has a history of COPD and currently smokes.  He reports he only uses albuterol  inhalers but has been out.  Pt has taken cough medicine OTC for symptoms.  His hospitalization for COPD in the past 6 months to year.  Was last treated for COPD exacerbation in November.  Pt has no other concerns at this time.    Shortness of Breath Associated symptoms: cough   Cough Associated symptoms: shortness of breath     History reviewed. No pertinent past medical history.  There are no active problems to display for this patient.   Past Surgical History:  Procedure Laterality Date   KNEE CARTILAGE SURGERY         Home Medications    Prior to Admission medications  Medication Sig Start Date End Date Taking? Authorizing Provider  albuterol  (VENTOLIN  HFA) 108 (90 Base) MCG/ACT inhaler Inhale 1-2 puffs into the lungs every 6 (six) hours as needed. 03/25/24  Yes Destany Severns, Jodi R, NP  azithromycin  (ZITHROMAX ) 250 MG tablet Take 1 tablet (250 mg total) by mouth daily. Take first 2 tablets together, then 1 every day until finished. 03/25/24  Yes Tanzie Rothschild, Jodi R, NP  predniSONE  (DELTASONE ) 50 MG tablet Take 1 tablet daily for 5 days 03/25/24  Yes Tailor Lucking, Jodi R, NP  promethazine -dextromethorphan (PROMETHAZINE -DM) 6.25-15 MG/5ML syrup Take 5 mLs by mouth 3 (three) times daily as needed for cough. 03/25/24  Yes Amali Uhls, Jodi R, NP  EPINEPHrine  0.3 mg/0.3 mL IJ SOAJ injection IM injection as needed anaphylactic reaction 06/26/19   Saguier, [place], PA-C  hydrochlorothiazide   (HYDRODIURIL ) 12.5 MG tablet Day 1-7: Take 1 tablet daily. Thereafter, start taking 2 tablets daily. 02/13/24   Christopher Savannah, PA-C  ketoconazole  (NIZORAL ) 2 % cream Apply 1 Application topically daily. 11/04/23   Kelie Gainey, Jodi R, NP  oxyCODONE -acetaminophen  (PERCOCET/ROXICET) 5-325 MG tablet Take 1 tablet by mouth every 6 (six) hours as needed for severe pain (pain score 7-10). 11/15/23   Towana Ozell BROCKS, MD  triamcinolone  ointment (KENALOG ) 0.5 % Apply 1 Application topically 2 (two) times daily. Apply thin layer to affected areas twice a day for up to 2 weeks 10/26/23   Iola Lukes, FNP    Family History History reviewed. No pertinent family history.  Social History Social History[1]   Allergies   Bee venom and Penicillins   Review of Systems Review of Systems  Respiratory:  Positive for cough and shortness of breath.      Physical Exam Triage Vital Signs ED Triage Vitals  Encounter Vitals Group     BP 03/25/24 1433 135/87     Girls Systolic BP Percentile --      Girls Diastolic BP Percentile --      Boys Systolic BP Percentile --      Boys Diastolic BP Percentile --      Pulse Rate 03/25/24 1433 91     Resp 03/25/24 1433 20     Temp 03/25/24 1435 98.7 F (37.1 C)     Temp src --  SpO2 03/25/24 1433 90 %     Weight --      Height --      Head Circumference --      Peak Flow --      Pain Score 03/25/24 1433 0     Pain Loc --      Pain Education --      Exclude from Growth Chart --    No data found.  Updated Vital Signs BP 135/87   Pulse 91   Temp 98.7 F (37.1 C)   Resp 20   SpO2 90%   Visual Acuity Right Eye Distance:   Left Eye Distance:   Bilateral Distance:    Right Eye Near:   Left Eye Near:    Bilateral Near:     Physical Exam Vitals and nursing note reviewed.  Constitutional:      General: He is not in acute distress.    Appearance: Normal appearance. He is not ill-appearing or toxic-appearing.  HENT:     Head: Normocephalic and  atraumatic.     Right Ear: Tympanic membrane and ear canal normal.     Left Ear: Tympanic membrane and ear canal normal.     Nose: Congestion present.     Mouth/Throat:     Mouth: Mucous membranes are moist.     Pharynx: No oropharyngeal exudate or posterior oropharyngeal erythema.  Eyes:     Pupils: Pupils are equal, round, and reactive to light.  Cardiovascular:     Rate and Rhythm: Normal rate and regular rhythm.     Heart sounds: Normal heart sounds.  Pulmonary:     Effort: Pulmonary effort is normal.     Breath sounds: Wheezing present.     Comments: Mild expiratory wheezing bilateral lower bases Musculoskeletal:     Cervical back: Normal range of motion and neck supple.  Lymphadenopathy:     Cervical: No cervical adenopathy.  Skin:    General: Skin is warm and dry.  Neurological:     General: No focal deficit present.     Mental Status: He is alert and oriented to person, place, and time.  Psychiatric:        Mood and Affect: Mood normal.        Behavior: Behavior normal.      UC Treatments / Results  Labs (all labs ordered are listed, but only abnormal results are displayed) Labs Reviewed - No data to display  EKG   Radiology No results found.  Procedures Procedures (including critical care time)  Medications Ordered in UC Medications  ipratropium-albuterol  (DUONEB) 0.5-2.5 (3) MG/3ML nebulizer solution 3 mL (3 mLs Nebulization Given 03/25/24 1453)    Initial Impression / Assessment and Plan / UC Course  I have reviewed the triage vital signs and the nursing notes.  Pertinent labs & imaging results that were available during my care of the patient were reviewed by me and considered in my medical decision making (see chart for details).     Reviewed exam and symptoms with patient.  Discussed COPD exacerbation.  DuoNeb provided and O2 increased to 94%.  Will treat with azithromycin , prednisone  and I did refill his albuterol  inhaler.  Will do promethazine   as needed for cough.  Encourage rest fluids and PCP follow-up in 2 days for recheck.  ER precautions reviewed and patient verbalized understanding Final Clinical Impressions(s) / UC Diagnoses   Final diagnoses:  COPD exacerbation [facility])     Discharge Instructions  I refilled your albuterol  inhaler.  Start azithromycin  antibiotic as prescribed.  Also start prednisone  daily for 5 days.  You may do Promethazine  DM as needed for your cough.  Please note this medication will make you drowsy.  No alcohol or driving while on this medication.  Lots of rest and fluids.  Follow-up with your PCP in 2 days for recheck and go to the ER ASAP if you develop any worsening symptoms.  Hope you feel better soon!     ED Prescriptions     Medication Sig Dispense Auth. Provider   azithromycin  (ZITHROMAX ) 250 MG tablet Take 1 tablet (250 mg total) by mouth daily. Take first 2 tablets together, then 1 every day until finished. 6 tablet Clemencia Helzer, Jodi R, NP   predniSONE  (DELTASONE ) 50 MG tablet Take 1 tablet daily for 5 days 5 tablet Malakye Nolden, Jodi R, NP   albuterol  (VENTOLIN  HFA) 108 (90 Base) MCG/ACT inhaler Inhale 1-2 puffs into the lungs every 6 (six) hours as needed. 1 each Akira Perusse, Jodi R, NP   promethazine -dextromethorphan (PROMETHAZINE -DM) 6.25-15 MG/5ML syrup Take 5 mLs by mouth 3 (three) times daily as needed for cough. 118 mL Senan Urey, Jodi R, NP      PDMP not reviewed this encounter.     [1]  Social History Tobacco Use   Smoking status: Every Day    Current packs/day: 2.00    Average packs/day: 2.0 packs/day for 43.0 years (85.9 ttl pk-yrs)    Types: E-cigarettes, Cigarettes    Start date: 1983   Smokeless tobacco: Never  Vaping Use   Vaping status: Some Days  Substance Use Topics   Alcohol use: No   Drug use: Not Currently    Types: Other-see comments    Comment: used some oxycotin he found     Loreda Myla SAUNDERS, NP 03/25/24 1513

## 2024-03-27 ENCOUNTER — Ambulatory Visit
Admission: RE | Admit: 2024-03-27 | Discharge: 2024-03-27 | Disposition: A | Source: Ambulatory Visit | Attending: Family Medicine | Admitting: Family Medicine

## 2024-03-27 ENCOUNTER — Other Ambulatory Visit: Payer: Self-pay | Admitting: Family Medicine

## 2024-03-27 DIAGNOSIS — I509 Heart failure, unspecified: Secondary | ICD-10-CM

## 2024-03-27 DIAGNOSIS — J441 Chronic obstructive pulmonary disease with (acute) exacerbation: Secondary | ICD-10-CM

## 2024-04-09 ENCOUNTER — Other Ambulatory Visit: Payer: Self-pay

## 2024-04-09 ENCOUNTER — Emergency Department (HOSPITAL_BASED_OUTPATIENT_CLINIC_OR_DEPARTMENT_OTHER)
Admission: EM | Admit: 2024-04-09 | Discharge: 2024-04-09 | Disposition: A | Discharge status: Home / Self Care | Attending: Emergency Medicine | Admitting: Emergency Medicine

## 2024-04-09 ENCOUNTER — Encounter (HOSPITAL_BASED_OUTPATIENT_CLINIC_OR_DEPARTMENT_OTHER): Payer: Self-pay | Admitting: Emergency Medicine

## 2024-04-09 ENCOUNTER — Emergency Department (HOSPITAL_BASED_OUTPATIENT_CLINIC_OR_DEPARTMENT_OTHER)

## 2024-04-09 DIAGNOSIS — R0789 Other chest pain: Secondary | ICD-10-CM | POA: Insufficient documentation

## 2024-04-09 DIAGNOSIS — J449 Chronic obstructive pulmonary disease, unspecified: Secondary | ICD-10-CM | POA: Insufficient documentation

## 2024-04-09 DIAGNOSIS — R079 Chest pain, unspecified: Secondary | ICD-10-CM | POA: Diagnosis present

## 2024-04-09 DIAGNOSIS — R059 Cough, unspecified: Secondary | ICD-10-CM | POA: Diagnosis not present

## 2024-04-09 HISTORY — DX: Chronic obstructive pulmonary disease, unspecified: J44.9

## 2024-04-09 LAB — BASIC METABOLIC PANEL WITH GFR
Anion gap: 11 (ref 5–15)
BUN: 25 mg/dL — ABNORMAL HIGH (ref 6–20)
CO2: 30 mmol/L (ref 22–32)
Calcium: 9.4 mg/dL (ref 8.9–10.3)
Chloride: 102 mmol/L (ref 98–111)
Creatinine, Ser: 1 mg/dL (ref 0.61–1.24)
GFR, Estimated: >60 mL/min
Glucose, Bld: 101 mg/dL — ABNORMAL HIGH (ref 70–99)
Potassium: 3.6 mmol/L (ref 3.5–5.1)
Sodium: 143 mmol/L (ref 135–145)

## 2024-04-09 LAB — CBC
HCT: 46.9 % (ref 39.0–52.0)
Hemoglobin: 16.5 g/dL (ref 13.0–17.0)
MCH: 31.5 pg (ref 26.0–34.0)
MCHC: 35.2 g/dL (ref 30.0–36.0)
MCV: 89.7 fL (ref 80.0–100.0)
Platelets: 281 K/uL (ref 150–400)
RBC: 5.23 MIL/uL (ref 4.22–5.81)
RDW: 12.6 % (ref 11.5–15.5)
WBC: 19.8 K/uL — ABNORMAL HIGH (ref 4.0–10.5)
nRBC: 0 % (ref 0.0–0.2)

## 2024-04-09 LAB — HEPATIC FUNCTION PANEL
ALT: 33 U/L (ref 0–44)
AST: 22 U/L (ref 15–41)
Albumin: 4.3 g/dL (ref 3.5–5.0)
Alkaline Phosphatase: 111 U/L (ref 38–126)
Bilirubin, Direct: <0.1 mg/dL (ref 0.0–0.2)
Total Bilirubin: 0.2 mg/dL (ref 0.0–1.2)
Total Protein: 6.9 g/dL (ref 6.5–8.1)

## 2024-04-09 LAB — LIPASE, BLOOD: Lipase: 40 U/L (ref 11–51)

## 2024-04-09 LAB — TROPONIN T, HIGH SENSITIVITY
Troponin T High Sensitivity: <15 ng/L (ref 0–19)
Troponin T High Sensitivity: <15 ng/L (ref 0–19)

## 2024-04-09 MED ORDER — DICLOFENAC SODIUM 1 % EX GEL: 2.0000 g | Freq: Four times a day (QID) | CUTANEOUS | 0 refills | Status: AC | PRN

## 2024-04-09 MED ORDER — MORPHINE SULFATE (PF) 4 MG/ML IV SOLN
4.0000 mg | Freq: Once | INTRAVENOUS | Status: AC
  Administered 2024-04-09: 4 mg via INTRAVENOUS
  Filled 2024-04-09: qty 1

## 2024-04-09 MED ORDER — KETOROLAC TROMETHAMINE 30 MG/ML IJ SOLN
30.0000 mg | Freq: Once | INTRAMUSCULAR | Status: AC
  Administered 2024-04-09: 30 mg via INTRAVENOUS
  Filled 2024-04-09: qty 1

## 2024-04-09 MED ORDER — ONDANSETRON HCL 4 MG/2ML IJ SOLN
4.0000 mg | Freq: Once | INTRAMUSCULAR | Status: AC
  Administered 2024-04-09: 4 mg via INTRAVENOUS
  Filled 2024-04-09: qty 2

## 2024-04-09 MED ORDER — NAPROXEN 500 MG PO TABS: 500.0000 mg | ORAL_TABLET | Freq: Two times a day (BID) | ORAL | 0 refills | Status: AC

## 2024-04-09 MED ORDER — IOHEXOL 350 MG/ML SOLN
75.0000 mL | Freq: Once | INTRAVENOUS | Status: AC | PRN
  Administered 2024-04-09: 75 mL via INTRAVENOUS

## 2024-04-09 MED ORDER — METHOCARBAMOL 500 MG PO TABS: 500.0000 mg | ORAL_TABLET | Freq: Three times a day (TID) | ORAL | 0 refills | Status: AC | PRN

## 2024-04-09 NOTE — Discharge Instructions (Signed)

## 2024-04-09 NOTE — ED Notes (Signed)
 Patient transported to CT

## 2024-04-09 NOTE — ED Provider Notes (Signed)
 "  Emergency Department Provider Note   I have reviewed the triage vital signs and the nursing notes.   HISTORY  Chief Complaint Chest Pain   HPI Kyle Frazier is a 53 y.o. male with past history of COPD presents to the emergency department with acute onset left lateral chest pain.  He describes sharp, stabbing pain to the left lateral chest which began 1 to 2 hours prior to arrival.  No shortness of breath.  No abdominal pain symptoms.  No vomiting or diaphoresis.  He does have some cough but notes he was diagnosed with COPD 2 months prior and symptoms are stable from that timeframe.  No injury.  No ripping/tearing pain to the back or abdomen pain.  Past Medical History:  Diagnosis Date   COPD (chronic obstructive pulmonary disease) (HCC)     Review of Systems  Constitutional: No fever/chills Cardiovascular: Positive chest pain. Respiratory: Baseline shortness of breath. Gastrointestinal: No abdominal pain.  No nausea, no vomiting.   Neurological: Negative for headaches.  ____________________________________________   PHYSICAL EXAM:  VITAL SIGNS: ED Triage Vitals  Encounter Vitals Group     BP 04/09/24 0039 (!) 149/104     Pulse Rate 04/09/24 0039 (!) 105     Resp 04/09/24 0039 (!) 22     Temp 04/09/24 0039 98.1 F (36.7 C)     Temp Source 04/09/24 0039 Oral     SpO2 04/09/24 0035 97 %     Weight 04/09/24 0036 277 lb (125.6 kg)     Height 04/09/24 0036 5' 8 (1.727 m)   Constitutional: Alert and oriented. Well appearing and in no acute distress. Eyes: Conjunctivae are normal.  Head: Atraumatic. Nose: No congestion/rhinnorhea. Mouth/Throat: Mucous membranes are moist.  Neck: No stridor. Cardiovascular: Normal rate, regular rhythm. Good peripheral circulation. Grossly normal heart sounds.   Respiratory: Normal respiratory effort.  No retractions. Lungs CTAB. Gastrointestinal: Soft and nontender. No distention.  Musculoskeletal: No gross deformities of  extremities. Neurologic:  Normal speech and language.  Skin:  Skin is warm, dry and intact. No rash noted.  ____________________________________________   LABS (all labs ordered are listed, but only abnormal results are displayed)  Labs Reviewed  BASIC METABOLIC PANEL WITH GFR - Abnormal; Notable for the following components:      Result Value   Glucose, Bld 101 (*)    BUN 25 (*)    All other components within normal limits  CBC - Abnormal; Notable for the following components:   WBC 19.8 (*)    All other components within normal limits  HEPATIC FUNCTION PANEL  LIPASE, BLOOD  TROPONIN T, HIGH SENSITIVITY  TROPONIN T, HIGH SENSITIVITY   ____________________________________________  EKG   EKG Interpretation Date/Time:  Monday April 09 2024 00:37:25 EST Ventricular Rate:  106 PR Interval:  143 QRS Duration:  91 QT Interval:  323 QTC Calculation: 429 R Axis:   106  Text Interpretation: Sinus tachycardia Anterior infarct, old Confirmed by Darra Chew [phone]) on 04/09/2024 2:16:41 AM        ____________________________________________  RADIOLOGY  CT Angio Chest PE W and/or Wo Contrast Result Date: 04/09/2024 EXAM: CTA CHEST 04/09/2024 02:13:00 AM TECHNIQUE: CTA of the chest was performed without and with the administration of 75 mL of intravenous iohexol  (OMNIPAQUE ) 350 MG/ML injection. Multiplanar reformatted images are provided for review. MIP images are provided for review. Automated exposure control, iterative reconstruction, and/or weight based adjustment of the mA/kV was utilized to reduce the radiation dose to as  low as reasonably achievable. COMPARISON: None available. CLINICAL HISTORY: Left chest pain, COPD. Pulmonary embolism (PE) suspected, high prob. FINDINGS: PULMONARY ARTERIES: Pulmonary arteries are adequately opacified for evaluation. Central pulmonary arteries are of normal caliber. No acute pulmonary embolism. MEDIASTINUM: Moderate multivessel coronary  artery calcification. Global cardiac size within normal limits. No pericardial effusion. No significant atherosclerotic calcification noted within the thoracic aorta. No aortic aneurysm. LYMPH NODES: No mediastinal, hilar or axillary lymphadenopathy. LUNGS AND PLEURA: The lungs are without acute process. No focal consolidation or pulmonary edema. No evidence of pleural effusion or pneumothorax. UPPER ABDOMEN: Limited images of the upper abdomen are unremarkable. SOFT TISSUES AND BONES: Mild bilateral gynecomastia. Osseous structures are age appropriate. No acute bone abnormality. No lytic or blastic bone lesion. IMPRESSION: 1. No pulmonary embolism. No acute pulmonary abnormality. 2. Moderate multivessel coronary artery calcification. Electronically signed by: Dorethia Molt MD 04/09/2024 02:42 AM EST RP Workstation: [id number]   DG Chest 2 View Result Date: 04/09/2024 EXAM: 2 VIEW(S) XRAY OF THE CHEST 04/09/2024 01:40:00 AM COMPARISON: 03/27/2024 CLINICAL HISTORY: CP FINDINGS: LUNGS AND PLEURA: No focal pulmonary opacity. No pleural effusion. No pneumothorax. HEART AND MEDIASTINUM: No acute abnormality of the cardiac and mediastinal silhouettes. BONES AND SOFT TISSUES: Thoracic degenerative changes. IMPRESSION: 1. No acute findings. Electronically signed by: Greig Pique MD 04/09/2024 01:51 AM EST RP Workstation: [id number]    ____________________________________________   PROCEDURES  Procedure(s) performed:   Procedures  None  ____________________________________________   INITIAL IMPRESSION / ASSESSMENT AND PLAN / ED COURSE  Pertinent labs & imaging results that were available during my care of the patient were reviewed by me and considered in my medical decision making (see chart for details).   This patient is Presenting for Evaluation of CP, which does require a range of treatment options, and is a complaint that involves a high risk of morbidity and mortality.  The Differential  Diagnoses includes but is not exclusive to acute coronary syndrome, aortic dissection, pulmonary embolism, cardiac tamponade, community-acquired pneumonia, pericarditis, musculoskeletal chest wall pain, etc.   Critical Interventions-    Medications  ketorolac  (TORADOL ) 30 MG/ML injection 30 mg (has no administration in time range)  morphine  (PF) 4 MG/ML injection 4 mg (4 mg Intravenous Given 04/09/24 0059)  ondansetron  (ZOFRAN ) injection 4 mg (4 mg Intravenous Given 04/09/24 0058)  iohexol  (OMNIPAQUE ) 350 MG/ML injection 75 mL (75 mLs Intravenous Contrast Given 04/09/24 0214)    Reassessment after intervention: pain improved.   Clinical Laboratory Tests Ordered, included CBC with leukocytosis to 19.8.  No anemia.  No acute kidney injury.  LFTs, bilirubin, lipase negative.  Troponin negative x 2.   Radiologic Tests Ordered, included CXR and CTA PE. I independently interpreted the images and agree with radiology interpretation.   Cardiac Monitor Tracing which shows sinus tachycardia.   Social Determinants of Health Risk patient has a smoking history.   Medical Decision Making: Summary:  Patient arrives to the emergency department acute onset sharp left-sided chest pain.  He has associated sinus tachycardia.  PE is a consideration along with musculoskeletal pain.  Fairly atypical for ACS but given risk factors will obtain screening troponins.  EKG is nonischemic.  Reevaluation with update and discussion with patient. Pain improved. Plan for NSAIDs and PCP follow up plan. Will also refer to Cardiology given multiple coronary calcifications on CTA. Doubt pain today is related to ACS but further outpatient risk stratification with Cardiology seem appropriate.   Considered admission but ED workup for CP is reassuring. Patient stable  for discharge.   Patient's presentation is most consistent with acute presentation with potential threat to life or bodily function.   Disposition:  discharge  ____________________________________________  FINAL CLINICAL IMPRESSION(S) / ED DIAGNOSES  Final diagnoses:  Atypical chest pain     NEW OUTPATIENT MEDICATIONS STARTED DURING THIS VISIT:  New Prescriptions   DICLOFENAC  SODIUM (VOLTAREN ) 1 % GEL    Apply 2 g topically 4 (four) times daily as needed.   METHOCARBAMOL  (ROBAXIN ) 500 MG TABLET    Take 1 tablet (500 mg total) by mouth every 8 (eight) hours as needed for muscle spasms.   NAPROXEN  (NAPROSYN ) 500 MG TABLET    Take 1 tablet (500 mg total) by mouth 2 (two) times daily for 10 days.    Note:  This document was prepared using Dragon voice recognition software and may include unintentional dictation errors.  Fonda Law, MD, [facility] Emergency Medicine    Saree Krogh, Fonda MATSU, MD 04/09/24 [phone]  "

## 2024-04-09 NOTE — ED Triage Notes (Signed)
 Left chest pain starting today, states stabbing pain, EDP told pt may be developing COPD 2 months ago.

## 2024-04-10 ENCOUNTER — Other Ambulatory Visit (HOSPITAL_COMMUNITY): Payer: Self-pay | Admitting: Family Medicine

## 2024-04-10 ENCOUNTER — Ambulatory Visit: Attending: Cardiovascular Disease | Admitting: Cardiovascular Disease

## 2024-04-10 VITALS — BP 100/70 | HR 89 | Ht 68.0 in | Wt 278.0 lb

## 2024-04-10 DIAGNOSIS — R0602 Shortness of breath: Secondary | ICD-10-CM | POA: Diagnosis not present

## 2024-04-10 DIAGNOSIS — R072 Precordial pain: Secondary | ICD-10-CM | POA: Diagnosis not present

## 2024-04-10 DIAGNOSIS — I5030 Unspecified diastolic (congestive) heart failure: Secondary | ICD-10-CM

## 2024-04-10 DIAGNOSIS — J449 Chronic obstructive pulmonary disease, unspecified: Secondary | ICD-10-CM

## 2024-04-10 DIAGNOSIS — Z87891 Personal history of nicotine dependence: Secondary | ICD-10-CM | POA: Diagnosis not present

## 2024-04-10 MED ORDER — METOPROLOL TARTRATE 100 MG PO TABS: 100.0000 mg | ORAL_TABLET | Freq: Once | ORAL | 0 refills | Status: DC

## 2024-04-10 NOTE — Progress Notes (Signed)
 CARDIOLOGY CONSULT NOTE       Patient ID: Kyle Frazier MRN: [id number] DOB/AGE: 550-Jul-1972 53 y.o.  Referring Physician: [facility] Primary Physician: Patient, No Pcp Per Primary Cardiologist: New Reason for Consultation: Chest pain    HPI:  53 y.o. referred by [facility] for chest pain. Seen there yesterday. History of COPD Had acute onset left lateral chest pain. Sharp, stabbing Has noted recent cough. BP was elevated in ER. CTA no PE or acute lung abnormality Moderate multivessel coronary calcium. CXR NAD He was Rx with Toradol , Morphine  and Zofran . His WBC was elevated at 19.8 with neutrophil predominance. Note he had been on prednisone  prior to labs for bronchitis/URI.  He smokes 2 ppd since age 75. He will start Chantix once he's done with prednisone  taper. 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  Social History   Socioeconomic History   Marital status: Married    Spouse name: Not on file   Number of children: Not on file   Years of education: Not on file   Highest education level: Not on file  Occupational History   Not on file  Tobacco Use   Smoking status: Every Day    Current packs/day: 2.00    Average packs/day: 2.0 packs/day for 43.0 years (86.0 ttl pk-yrs)    Types: E-cigarettes, Cigarettes    Start date: 70   Smokeless tobacco: Never  Vaping Use   Vaping status: Some Days  Substance and Sexual Activity   Alcohol use: No   Drug use: Not Currently    Types: Other-see comments    Comment: used some oxycotin he found   Sexual activity: Yes  Other Topics Concern   Not on file  Social History Narrative   Not on file   Social  Drivers of Health   Tobacco Use: High Risk (04/09/2024)   Patient History    Smoking Tobacco Use: Every Day    Smokeless Tobacco Use: Never    Passive Exposure: Not on file  Financial Resource Strain: Not on file  Food Insecurity: Not on file  Transportation Needs: Not on file  Physical Activity: Not on file  Stress: Not on file  Social Connections: Not on file  Intimate Partner Violence: Not on file  Depression (EYV7-0): Not on file  Alcohol Screen: Not on file  Housing: Not on file  Utilities: Not on file  Health Literacy: Not on file    Past Surgical History:  Procedure Laterality Date   KNEE CARTILAGE SURGERY       Current Medications[1]    Physical Exam: Blood pressure 100/70, pulse 89, height 5' 8 (1.727 m), weight 278 lb (126.1 kg), SpO2 93%.  Affect appropriate Overweight bronchitic male  HEENT: normal Neck supple with no adenopathy JVP normal no bruits no thyromegaly Lungs clear with no wheezing and good diaphragmatic motion Heart:  S1/S2 no murmur, no rub, gallop or click PMI normal Abdomen: benighn, BS positve, no tenderness, no AAA no bruit.  No HSM or HJR Distal pulses intact with no bruits No edema Neuro non-focal Skin warm and dry No muscular weakness  Labs:   Lab Results  Component Value Date   WBC 19.8 (H) 04/09/2024   HGB 16.5 04/09/2024   HCT 46.9 04/09/2024   MCV 89.7 04/09/2024   PLT 281 04/09/2024    Recent Labs  Lab 04/09/24 0042 04/09/24 0110  NA 143  --   K 3.6  --   CL 102  --   CO2 30  --   BUN 25*  --   CREATININE 1.00  --   CALCIUM 9.4  --   PROT  --  6.9  BILITOT  --  0.2  ALKPHOS  --  111  ALT  --  33  AST  --  22  GLUCOSE 101*  --    No results found for: CKTOTAL, CKMB, CKMBINDEX, TROPONINI  Lab Results  Component Value Date   CHOL 107 06/26/2019   CHOL 147 05/04/2018   Lab Results  Component Value Date   HDL 33.30 (L) 06/26/2019   HDL 31.00 (L) 05/04/2018   Lab Results  Component Value  Date   LDLCALC 65 06/26/2019   LDLCALC 91 05/04/2018   Lab Results  Component Value Date   TRIG 44.0 06/26/2019   TRIG 129.0 05/04/2018   Lab Results  Component Value Date   CHOLHDL 3 06/26/2019   CHOLHDL 5 05/04/2018   No results found for: LDLDIRECT    Radiology: CT Angio Chest PE W and/or Wo Contrast Result Date: 04/09/2024 EXAM: CTA CHEST 04/09/2024 02:13:00 AM TECHNIQUE: CTA of the chest was performed without and with the administration of 75 mL of intravenous iohexol  (OMNIPAQUE ) 350 MG/ML injection. Multiplanar reformatted images are provided for review. MIP images are provided for review. Automated exposure control, iterative reconstruction, and/or weight based adjustment of the mA/kV was utilized to reduce the radiation dose to as low as reasonably achievable. COMPARISON: None available. CLINICAL HISTORY: Left chest pain, COPD. Pulmonary embolism (PE) suspected, high prob. FINDINGS: PULMONARY ARTERIES: Pulmonary arteries are adequately opacified for evaluation. Central pulmonary arteries are of normal caliber. No acute pulmonary embolism. MEDIASTINUM: Moderate multivessel coronary artery calcification. Global cardiac size within normal limits. No pericardial effusion. No significant atherosclerotic calcification noted within the thoracic aorta. No aortic aneurysm. LYMPH NODES: No mediastinal, hilar or axillary lymphadenopathy. LUNGS AND PLEURA: The lungs are without acute process. No focal consolidation or pulmonary edema. No evidence of pleural effusion or pneumothorax. UPPER ABDOMEN: Limited images of the upper abdomen are unremarkable. SOFT TISSUES AND BONES: Mild bilateral gynecomastia. Osseous structures are age appropriate. No acute bone abnormality. No lytic or blastic bone lesion. IMPRESSION: 1. No pulmonary embolism. No acute pulmonary abnormality. 2. Moderate multivessel coronary artery calcification. Electronically signed by: Dorethia Molt MD 04/09/2024 02:42 AM EST RP  Workstation: [id number]   DG Chest 2 View Result Date: 04/09/2024 EXAM: 2 VIEW(S) XRAY OF THE CHEST 04/09/2024 01:40:00 AM COMPARISON: 03/27/2024 CLINICAL HISTORY: CP FINDINGS: LUNGS AND PLEURA: No focal pulmonary opacity. No pleural effusion. No pneumothorax. HEART AND MEDIASTINUM: No acute abnormality of the cardiac and mediastinal silhouettes. BONES AND SOFT TISSUES: Thoracic degenerative changes. IMPRESSION: 1. No acute findings. Electronically signed by: Greig Pique MD 04/09/2024 01:51 AM EST RP Workstation: [id number]   DG Chest 2 View Result Date: 03/29/2024 EXAM: 2 VIEW(S) XRAY OF THE CHEST 03/27/2024 04:07:33 PM COMPARISON: 05/03/2016 CLINICAL HISTORY: CHF / COPD FINDINGS: LUNGS AND PLEURA: No focal pulmonary opacity. No pleural effusion. No pneumothorax. HEART AND MEDIASTINUM: Hiatal hernia is noted. BONES AND SOFT TISSUES: Thoracic degenerative changes. IMPRESSION: 1. No acute findings.  Electronically signed by: Oneil Devonshire MD 03/29/2024 12:06 AM EST RP Workstation: [id number]    EKG: SR rate 106 poor R wave progression   ASSESSMENT AND PLAN:   Atypical chest pain:  R/O CTA no PE but moderate age advanced calcium coronary arteries. Shared decision making favor lexiscan myovue to further risk stratify Echo for RV/LV function given chest pain and COPD Elevated WBC:  no signs of infection likely de margination from pulse steroid Rx of bronchitis  COPD:  f/u primary had prednisone  taper 03/25/24 no active wheezing Echo to assess RV/LV function and assess PA pressures  HTN:  Has been on hydrochlorothiazide  stable  Smoking:  start Chantix per primary once off prednisone . Yearly CT lung cancer screening   Cardiac CTA Lopressor 100 mg 2 hours prior Echo    F/U in a year if testing low risk   Signed: Maude Emmer 04/10/2024, 4:10 PM      [1]  Current Outpatient Medications:    albuterol  (VENTOLIN  HFA) 108 (90 Base) MCG/ACT inhaler, Inhale 1-2 puffs into the lungs every 6  (six) hours as needed., Disp: 1 each, Rfl: 0   diclofenac  Sodium (VOLTAREN ) 1 % GEL, Apply 2 g topically 4 (four) times daily as needed., Disp: 100 g, Rfl: 0   EPINEPHrine  0.3 mg/0.3 mL IJ SOAJ injection, IM injection as needed anaphylactic reaction, Disp: 2 each, Rfl: 0   hydrochlorothiazide  (HYDRODIURIL ) 12.5 MG tablet, Day 1-7: Take 1 tablet daily. Thereafter, start taking 2 tablets daily., Disp: 90 tablet, Rfl: 0   ketoconazole  (NIZORAL ) 2 % cream, Apply 1 Application topically daily., Disp: 60 g, Rfl: 0   methocarbamol  (ROBAXIN ) 500 MG tablet, Take 1 tablet (500 mg total) by mouth every 8 (eight) hours as needed for muscle spasms., Disp: 20 tablet, Rfl: 0   moxifloxacin (AVELOX) 400 MG tablet, Take 400 mg by mouth daily., Disp: , Rfl:    naproxen  (NAPROSYN ) 500 MG tablet, Take 1 tablet (500 mg total) by mouth 2 (two) times daily for 10 days., Disp: 20 tablet, Rfl: 0   predniSONE  (DELTASONE ) 50 MG tablet, Take 1 tablet daily for 5 days, Disp: 5 tablet, Rfl: 0   promethazine -dextromethorphan (PROMETHAZINE -DM) 6.25-15 MG/5ML syrup, Take 5 mLs by mouth 3 (three) times daily as needed for cough., Disp: 118 mL, Rfl: 0   triamcinolone  ointment (KENALOG ) 0.5 %, Apply 1 Application topically 2 (two) times daily. Apply thin layer to affected areas twice a day for up to 2 weeks, Disp: 45 g, Rfl: 0   oxyCODONE -acetaminophen  (PERCOCET/ROXICET) 5-325 MG tablet, Take 1 tablet by mouth every 6 (six) hours as needed for severe pain (pain score 7-10). (Patient not taking: Reported on 04/10/2024), Disp: 10 tablet, Rfl: 0

## 2024-04-10 NOTE — Patient Instructions (Addendum)
 Medication Instructions:  ONCE Metoprolol Tartrate (Lopressor) 100 mg two hour prior to coronary CT scan.  *If you need a refill on your cardiac medications before your next appointment, please call your pharmacy*  Lab Work: None ordered today. If you have labs (blood work) drawn today and your tests are completely normal, you will receive your results only by: MyChart Message (if you have MyChart) OR A paper copy in the mail If you have any lab test that is abnormal or we need to change your treatment, we will call you to review the results.  Testing/Procedures: Your provider has requested that you have a coronary CTA scan. Non-Cardiac CT Angiography (CTA), is a special type of CT scan that uses a computer to produce multi-dimensional views of major blood vessels throughout the body. In CT angiography, a contrast material is injected through an IV to help visualize the blood vessels.  Your physician has requested that you have an echocardiogram. Echocardiography is a painless test that uses sound waves to create images of your heart. It provides your doctor with information about the size and shape of your heart and how well your hearts chambers and valves are working. This procedure takes approximately one hour. There are no restrictions for this procedure. Please do NOT wear cologne, perfume, aftershave, or lotions (deodorant is allowed). Please arrive 15 minutes prior to your appointment time.  Please note: We ask at that you not bring children with you during ultrasound (echo/ vascular) testing. Due to room size and safety concerns, children are not allowed in the ultrasound rooms during exams. Our front office staff cannot provide observation of children in our lobby area while testing is being conducted. An adult accompanying a patient to their appointment will only be allowed in the ultrasound room at the discretion of the ultrasound technician under special circumstances. We apologize  for any inconvenience.   Follow-Up: At [facility], you and your health needs are our priority.  As part of our continuing mission to provide you with exceptional heart care, our providers are all part of one team.  This team includes your primary Cardiologist (physician) and Advanced Practice Providers or APPs (Physician Assistants and Nurse Practitioners) who all work together to provide you with the care you need, when you need it.  Your next appointment:   6 month(s)  Provider:   Maude Emmer, MD    We recommend signing up for the patient portal called MyChart.  Sign up information is provided on this After Visit Summary.  MyChart is used to connect with patients for Virtual Visits (Telemedicine).  Patients are able to view lab/test results, encounter notes, upcoming appointments, etc.  Non-urgent messages can be sent to your provider as well.   To learn more about what you can do with MyChart, go to [email or web address].   Other Instructions You have been referred to pulmonary medicine. Someone from their office will be calling you to set this appointment up in the near future.     Your cardiac CT will be scheduled at one of the below locations:   [facility] [address]nd floor [place], [place] [address] [phone]  Please follow these instructions carefully (unless otherwise directed):  An IV will be required for this test and Nitroglycerin will be given.  Hold all erectile dysfunction medications at least 3 days (72 hrs) prior to test. (Ie viagra, cialis, sildenafil, tadalafil, etc)   On the Night Before the Test: Be sure to Drink plenty  of water. Do not consume any caffeinated/decaffeinated beverages or chocolate 12 hours prior to your test. Do not take any antihistamines 12 hours prior to your test.  On the Day of the Test: Drink plenty of water until 1 hour prior to the test. Do not eat any food 1 hour prior to test. You may take  your regular medications prior to the test.  Take metoprolol (Lopressor) two hours prior to test. If you take Furosemide/Hydrochlorothiazide /Spironolactone, please HOLD on the morning of the test.      After the Test: Drink plenty of water. After receiving IV contrast, you may experience a mild flushed feeling. This is normal. On occasion, you may experience a mild rash up to 24 hours after the test. This is not dangerous. If this occurs, you can take Benadryl 25 mg and increase your fluid intake. If you experience trouble breathing, this can be serious. If it is severe call 911 IMMEDIATELY. If it is mild, please call our office.  We will call to schedule your test 2-4 weeks out understanding that some insurance companies will need an authorization prior to the service being performed.   For more information and frequently asked questions, please visit our website : [email or web address]  For non-scheduling related questions, please contact the cardiac imaging nurse navigator should you have any questions/concerns: Cardiac Imaging Nurse Navigators Direct Office Dial: [phone]   For scheduling needs, including cancellations and rescheduling, please call Brittany, [phone].

## 2024-04-18 ENCOUNTER — Ambulatory Visit (HOSPITAL_COMMUNITY)
Admission: RE | Admit: 2024-04-18 | Discharge: 2024-04-18 | Disposition: A | Discharge status: Home / Self Care | Source: Ambulatory Visit | Attending: Vascular Surgery | Admitting: Vascular Surgery

## 2024-04-18 ENCOUNTER — Encounter: Payer: Self-pay | Admitting: Internal Medicine

## 2024-04-18 ENCOUNTER — Ambulatory Visit: Admitting: Internal Medicine

## 2024-04-18 ENCOUNTER — Telehealth: Payer: Self-pay | Admitting: *Deleted

## 2024-04-18 VITALS — BP 108/60 | HR 103 | Ht 68.0 in | Wt 285.0 lb

## 2024-04-18 DIAGNOSIS — F17210 Nicotine dependence, cigarettes, uncomplicated: Secondary | ICD-10-CM

## 2024-04-18 DIAGNOSIS — R0602 Shortness of breath: Secondary | ICD-10-CM | POA: Diagnosis present

## 2024-04-18 DIAGNOSIS — I5030 Unspecified diastolic (congestive) heart failure: Secondary | ICD-10-CM | POA: Diagnosis present

## 2024-04-18 DIAGNOSIS — J449 Chronic obstructive pulmonary disease, unspecified: Secondary | ICD-10-CM | POA: Diagnosis present

## 2024-04-18 DIAGNOSIS — R0609 Other forms of dyspnea: Secondary | ICD-10-CM | POA: Insufficient documentation

## 2024-04-18 LAB — CBC WITH DIFFERENTIAL/PLATELET
Basophils Absolute: 0 K/uL (ref 0.0–0.1)
Basophils Relative: 0.1 % (ref 0.0–3.0)
Eosinophils Absolute: 0 K/uL (ref 0.0–0.7)
Eosinophils Relative: 0 % (ref 0.0–5.0)
HCT: 45.3 % (ref 39.0–52.0)
Hemoglobin: 15.4 g/dL (ref 13.0–17.0)
Lymphocytes Relative: 3.6 % — ABNORMAL LOW (ref 12.0–46.0)
Lymphs Abs: 0.8 K/uL (ref 0.7–4.0)
MCHC: 34 g/dL (ref 30.0–36.0)
MCV: 92.2 fl (ref 78.0–100.0)
Monocytes Absolute: 1.1 K/uL — ABNORMAL HIGH (ref 0.1–1.0)
Monocytes Relative: 4.7 % (ref 3.0–12.0)
Neutro Abs: 20.9 K/uL — ABNORMAL HIGH (ref 1.4–7.7)
Neutrophils Relative %: 91.6 % — ABNORMAL HIGH (ref 43.0–77.0)
Platelets: 204 K/uL (ref 150.0–400.0)
RBC: 4.91 Mil/uL (ref 4.22–5.81)
RDW: 13.6 % (ref 11.5–15.5)
WBC: 22.8 K/uL (ref 4.0–10.5)

## 2024-04-18 LAB — ECHOCARDIOGRAM COMPLETE
AR max vel: 2.4 cm2
AV Area VTI: 3.06 cm2
AV Area mean vel: 2.73 cm2
AV Mean grad: 10 mmHg
AV Peak grad: 21 mmHg
Ao pk vel: 2.29 m/s
Area-P 1/2: 4.08 cm2
Height: 68 in
S' Lateral: 2.8 cm
Weight: 4560 [oz_av]

## 2024-04-18 LAB — TSH: TSH: 0.35 u[IU]/mL (ref 0.35–5.50)

## 2024-04-18 MED ORDER — PREDNISONE 10 MG PO TABS: ORAL_TABLET | ORAL | 5 refills | Status: AC

## 2024-04-18 MED ORDER — BREZTRI AEROSPHERE 160-9-4.8 MCG/ACT IN AERO: 2.0000 | INHALATION_SPRAY | Freq: Two times a day (BID) | RESPIRATORY_TRACT | Status: AC

## 2024-04-18 MED ORDER — PANTOPRAZOLE SODIUM 40 MG PO TBEC: 40.0000 mg | DELAYED_RELEASE_TABLET | Freq: Every day | ORAL | 2 refills | Status: AC

## 2024-04-18 MED ORDER — FAMOTIDINE 20 MG PO TABS: ORAL_TABLET | ORAL | 11 refills | Status: AC

## 2024-04-18 NOTE — Telephone Encounter (Signed)
 Copied from CRM [id number]. Topic: Clinical - Lab/Test Results >> Apr 18, 2024 12:12 PM Isabell A wrote: Reason for CRM: [facility] calling to report critical blood count   Callback number: Phone got disconnected while attempting to contact [facility]. >> Apr 18, 2024 12:25 PM Corean SAUNDERS wrote: Darice with [facility] is requesting a call back at  [phone] as she needs to relay critical result.   I spoke with Darice at [facility] regarding a critical lab, WBC=22.8, advised I would make Dr. Darlean aware.

## 2024-04-18 NOTE — Patient Instructions (Addendum)
 Plan A = Automatic = Always=    Breztri  Take 2 puffs first thing in am and then another 2 puffs about 12 hours later.    Work on inhaler technique:  relax and gently blow all the way out then take a nice smooth full deep breath back in, triggering the inhaler at same time you start breathing in.  Hold breath in for at least  5 seconds if you can. Blow out breztri   thru nose. Rinse and gargle with water when done.  If mouth or throat bother you at all,  try brushing teeth/gums/tongue with arm and hammer toothpaste/ make a slurry and gargle and spit out.   >>>  Remember how golfers warm up by taking practice swings - do this with an empty inhaler    Plan B = Backup (to supplement plan A, not to replace it) Use your albuterol  inhaler as a rescue medication to be used if you can't catch your breath by resting or slowing your pace  or doing a relaxed purse lip breathing pattern.  - The less you use it, the better it will work when you need it. - Ok to use the inhaler up to 2 puffs  every 4 hours if you must but call for appointment if use goes up over your usual need - Don't leave home without it !!  (think of it like the spare tire or starter fluid for your car)   Also  Ok to try albuterol  15 min before an activity (on alternating days)  that you know would usually make you short of breath and see if it makes any difference and if makes none then don't take albuterol  after activity unless you can't catch your breath as this means it's the resting that helps, not the albuterol .      Plan C =  if A and B don't work  > Prednisone  10 mg take  4 each am x 2 days,   2 each am x 2 days,  1 each am x 2 days and stop   Pantoprazole  (protonix ) 40 mg   Take  30-60 min before first meal of the day and Pepcid  (famotidine )  20 mg after supper until return to office - this is the best way to tell whether stomach acid is contributing to your problem.  The key is to stop smoking completely before smoking completely  stops you!     GERD (REFLUX)  is an extremely common cause of respiratory symptoms just like yours , many times with no obvious heartburn at all.    It can be treated with medication, but also with lifestyle changes including elevation of the head of your bed (ideally with 6 -8inch blocks under the headboard of your bed),  Smoking cessation, avoidance of late meals, excessive alcohol, and avoid fatty foods, chocolate, peppermint, colas, red wine, and acidic juices such as orange juice.  NO MINT OR MENTHOL PRODUCTS SO NO COUGH DROPS  USE SUGARLESS CANDY INSTEAD (Jolley ranchers or Stover's or Life Savers) or even ice chips will also do - the key is to swallow to prevent all throat clearing. NO OIL BASED VITAMINS - use powdered substitutes.  Avoid fish oil when coughing.   Please remember to go to the lab department   for your tests - we will call you with the results when they are available.       Please schedule a follow up office visit in 4 weeks, sooner if needed  with  all medications /inhalers/ solutions in hand so we can verify exactly what you are taking. This includes all medications from all doctors and over the counters   PFTs on return

## 2024-04-18 NOTE — Telephone Encounter (Signed)
 Dr. Darlean, Critical lab called from [facility].  WBC=22.8, please advise.  Thank you.

## 2024-04-18 NOTE — Progress Notes (Signed)
 "  Kyle Frazier, male    DOB: April 02, 1971   MRN: [id number]   Brief patient profile:  47 yowm   active smoker  referred to pulmonary clinic 04/18/2024 by Drs Norina  for ? COPD       Pt not previously seen by PCCM service.    Around 2008 started needing saba  then 2010 @ 225 lb developed sob cough and cp > better p prednisone  and this pattern has continued since then up to several times a year when too  cold or hot     History of Present Illness  04/18/2024  Pulmonary/ 1st office eval/Emyah Roznowski @ 285 last prednisone1/4/26 p starting to need it more frequently since  October 2025  Chief Complaint  Patient presents with   Consult    Referred by Dr. Maude Emmer.  Pt c/o SOB since Nov 2025.  He gets winded walking short distances such as lobby to exam room. He has cough- prod with white foamy to black mucus.   Dyspnea:  100 ft and uses wife's [facility] parking  Cough: last abx   on 04/15/24 not sure what/ mucus variably very dark almost black  Sleep: flat bed with wedge and one pillow and reports cough /  wheezes unless on prednisone   SABA use: last used an hour prior to OV   02 ldz:wnwz  LDSCT   No obvious day to day or daytime pattern/variability or assoc  mucus plugs or hemoptysis or cp or chest tightness, subjective wheeze or overt   hb symptoms.    Also denies any obvious fluctuation of symptoms with weather or environmental changes or other aggravating or alleviating factors except as outlined above   No unusual exposure hx or h/o childhood pna/ asthma or knowledge of premature birth.  Current Allergies, Complete Past Medical History, Past Surgical History, Family History, and Social History were reviewed in [employer] record.  ROS  The following are not active complaints unless bolded Hoarseness, sore throat, dysphagia (globus sensation) , dental problems, itching, sneezing,  nasal congestion or discharge of excess mucus or purulent secretions, ear ache,   fever,  chills, sweats, unintended wt loss or wt gain, classically pleuritic or exertional cp,  orthopnea pnd or arm/hand swelling  or leg swelling, presyncope, palpitations, abdominal pain, anorexia, nausea, vomiting, diarrhea  or change in bowel habits or change in bladder habits, change in stools or change in urine, dysuria, hematuria,  rash, arthralgias, visual complaints, headache, numbness, weakness or ataxia or problems with walking or coordination,  change in mood or  memory.             Outpatient Medications Prior to Visit  Medication Sig Dispense Refill   albuterol  (VENTOLIN  HFA) 108 (90 Base) MCG/ACT inhaler Inhale 1-2 puffs into the lungs every 6 (six) hours as needed. 1 each 0   diclofenac  Sodium (VOLTAREN ) 1 % GEL Apply 2 g topically 4 (four) times daily as needed. 100 g 0   hydrochlorothiazide  (HYDRODIURIL ) 12.5 MG tablet Day 1-7: Take 1 tablet daily. Thereafter, start taking 2 tablets daily. 90 tablet 0   ketoconazole  (NIZORAL ) 2 % cream Apply 1 Application topically daily. 60 g 0   methocarbamol  (ROBAXIN ) 500 MG tablet Take 1 tablet (500 mg total) by mouth every 8 (eight) hours as needed for muscle spasms. 20 tablet 0   naproxen  (NAPROSYN ) 500 MG tablet Take 1 tablet (500 mg total) by mouth 2 (two) times daily for 10 days. 20 tablet 0  oxyCODONE -acetaminophen  (PERCOCET/ROXICET) 5-325 MG tablet Take 1 tablet by mouth every 6 (six) hours as needed for severe pain (pain score 7-10). 10 tablet 0   promethazine -dextromethorphan (PROMETHAZINE -DM) 6.25-15 MG/5ML syrup Take 5 mLs by mouth 3 (three) times daily as needed for cough. 118 mL 0   triamcinolone  ointment (KENALOG ) 0.5 % Apply 1 Application topically 2 (two) times daily. Apply thin layer to affected areas twice a day for up to 2 weeks 45 g 0   EPINEPHrine  0.3 mg/0.3 mL IJ SOAJ injection IM injection as needed anaphylactic reaction (Patient not taking: Reported on 04/18/2024) 2 each 0   metoprolol  tartrate (LOPRESSOR ) 100 MG tablet Take 1  tablet (100 mg total) by mouth once. Take 90-120 minutes prior to scan. Hold for SBP less than 110. 1 tablet 0   moxifloxacin (AVELOX) 400 MG tablet Take 400 mg by mouth daily.     predniSONE  (DELTASONE ) 50 MG tablet Take 1 tablet daily for 5 days 5 tablet 0   No facility-administered medications prior to visit.        Objective:     BP 108/60   Pulse (!) 103   Ht 5' 8 (1.727 m) Comment: per pt  Wt 285 lb (129.3 kg)   SpO2 93% Comment: on RA  BMI 43.33 kg/m   SpO2: 93 % (on RA) amb wm struggles with details of care   Min exp wheeze/ cough on FVC   HEENT : Oropharynx  clear      Nasal turbinates  mild non-specific edema   NECK :  without  apparent JVD/ palpable Nodes/TM    LUNGS: no acc muscle use,  Nl contour chest  with min exp wheeze/ cough on FVC maneuver   CV:  RRR  no s3 or murmur or increase in P2, and no edema   ABD:  soft and nontender   MS:     ext warm without deformities Or obvious joint restrictions  calf tenderness, cyanosis or clubbing    SKIN: warm and dry without lesions    NEURO:  alert, approp, nl sensorium with  no motor or cerebellar deficits apparent.    Labs ordered/ reviewed:      Chemistry      Component Value Date/Time   NA 143 04/09/2024 0042   K 3.6 04/09/2024 0042   CL 102 04/09/2024 0042   CO2 30 04/09/2024 0042   BUN 25 (H) 04/09/2024 0042   CREATININE 1.00 04/09/2024 0042      Component Value Date/Time   CALCIUM 9.4 04/09/2024 0042   ALKPHOS 111 04/09/2024 0110   AST 22 04/09/2024 0110   ALT 33 04/09/2024 0110   BILITOT 0.2 04/09/2024 0110        Lab Results  Component Value Date   WBC 22.8 Repeated and verified X2. (HH) 04/18/2024   HGB 15.4 04/18/2024   HCT 45.3 04/18/2024   MCV 92.2 04/18/2024   PLT 204.0 04/18/2024     Lab Results  Component Value Date   DDIMER 0.22 04/18/2024      Lab Results  Component Value Date   TSH 0.35 04/18/2024               Assessment   Assessment & Plan DOE  (dyspnea on exertion) Active smoker/MM -  04/18/2024  After extensive coaching inhaler device,  effectiveness =    75% >  start breztri  - Allergy screen 04/18/2024 >  Eos 0.0 /  IgE 177 with alpha one AT Phenotype MM /  level  = 176  - 04/18/2024   Walked on RA  x  one  lap(s) =  approx 250  ft  @ slow pace, stopped due to leg pain / sob  with lowest 02 sats 99% - PFTs ordered     Group E in terms of symptoms/risk so  laba/lama/ICS  therefore appropriate rx at this point  >>>  brezrtri  and more approp SABA prn.   Comment: Re SABA :  I spent extra time with pt today reviewing appropriate use of albuterol  for prn use on exertion with the following points: 1) saba is for relief of sob that does not improve by walking a slower pace or resting but rather if the pt does not improve after trying this first. 2) If the pt is convinced, as many are, that saba helps recover from activity faster then it's easy to tell if this is the case by re-challenging : ie stop, take the inhaler, then p 5 minutes try the exact same activity (intensity of workload) that just caused the symptoms and see if they are substantially diminished or not after saba 3) if there is an activity that reproducibly causes the symptoms, try the saba 15 min before the activity on alternate days   If in fact the saba really does help, then fine to continue to use it prn but advised may need to look closer at the maintenance regimen (breztri  vs prior rx)  being used to achieve better control of airways disease with exertion.     Cigarette smoker 5   min discussion re active cigarette smoking in addition to office E&M  Ask about tobacco use: ongoing Advise quitting   I took an extended  opportunity with this patient to outline the consequences of continued cigarette use  in airway disorders based on all the data we have from the multiple national lung health studies (perfomed over decades at millions of dollars in cost)  indicating that  smoking cessation, not choice of inhalers or pulmonary physicians, is the most important aspect of his care.   Assess willingness:  Not committed at this point Assist in quit attempt:  Per PCP when ready Arrange follow up:   Follow up per Primary Care planned      Morbid obesity due to excess calories (HCC) Body mass index is 43.33 kg/m.  -  trending up  Lab Results  Component Value Date   TSH 0.35 04/18/2024    Contributing to doe and risk of GERD/dvt/ PE  >>>   reviewed the need and the process to achieve and maintain neg calorie balance > defer f/u primary care including intermittently monitoring thyroid status       >>>  F/u 4 weeks for pfts and with all meds in hand using a trust but verify approach to confirm accurate Medication  Reconciliation The principal here is that until we are certain that the  patients are doing what we've asked, it makes no sense to ask them to do more.  Each maintenance medication was reviewed in detail including emphasizing most importantly the difference between maintenance and prns and under what circumstances the prns are to be triggered using an action plan format where appropriate.  Total time for H and P, chart review, counseling, reviewing hfa device(s) , directly observing portions of ambulatory 02 saturation study/ and generating customized AVS unique to this office visit / same day charting = 46 min new pt eval  AVS  Patient Instructions  Plan A = Automatic = Always=    Breztri  Take 2 puffs first thing in am and then another 2 puffs about 12 hours later.    Work on inhaler technique:  relax and gently blow all the way out then take a nice smooth full deep breath back in, triggering the inhaler at same time you start breathing in.  Hold breath in for at least  5 seconds if you can. Blow out breztri   thru nose. Rinse and gargle with water when done.  If mouth or throat bother you at all,  try brushing teeth/gums/tongue with arm  and hammer toothpaste/ make a slurry and gargle and spit out.   >>>  Remember how golfers warm up by taking practice swings - do this with an empty inhaler    Plan B = Backup (to supplement plan A, not to replace it) Use your albuterol  inhaler as a rescue medication to be used if you can't catch your breath by resting or slowing your pace  or doing a relaxed purse lip breathing pattern.  - The less you use it, the better it will work when you need it. - Ok to use the inhaler up to 2 puffs  every 4 hours if you must but call for appointment if use goes up over your usual need - Don't leave home without it !!  (think of it like the spare tire or starter fluid for your car)   Also  Ok to try albuterol  15 min before an activity (on alternating days)  that you know would usually make you short of breath and see if it makes any difference and if makes none then don't take albuterol  after activity unless you can't catch your breath as this means it's the resting that helps, not the albuterol .      Plan C =  if A and B don't work  > Prednisone  10 mg take  4 each am x 2 days,   2 each am x 2 days,  1 each am x 2 days and stop   Pantoprazole  (protonix ) 40 mg   Take  30-60 min before first meal of the day and Pepcid  (famotidine )  20 mg after supper until return to office - this is the best way to tell whether stomach acid is contributing to your problem.  The key is to stop smoking completely before smoking completely stops you!     GERD (REFLUX)  is an extremely common cause of respiratory symptoms just like yours , many times with no obvious heartburn at all.    It can be treated with medication, but also with lifestyle changes including elevation of the head of your bed (ideally with 6 -8inch blocks under the headboard of your bed),  Smoking cessation, avoidance of late meals, excessive alcohol, and avoid fatty foods, chocolate, peppermint, colas, red wine, and acidic juices such as orange juice.   NO MINT OR MENTHOL PRODUCTS SO NO COUGH DROPS  USE SUGARLESS CANDY INSTEAD (Jolley ranchers or Stover's or Life Savers) or even ice chips will also do - the key is to swallow to prevent all throat clearing. NO OIL BASED VITAMINS - use powdered substitutes.  Avoid fish oil when coughing.   Please remember to go to the lab department   for your tests - we will call you with the results when they are available.       Please schedule a follow up office visit in 4  weeks, sooner if needed  with all medications /inhalers/ solutions in hand so we can verify exactly what you are taking. This includes all medications from all doctors and over the counters   PFTs on return    Ozell America, MD 04/21/2024      "

## 2024-04-19 ENCOUNTER — Telehealth: Payer: Self-pay | Admitting: Cardiovascular Disease

## 2024-04-19 NOTE — Telephone Encounter (Signed)
Faxed over requested documents.

## 2024-04-19 NOTE — Telephone Encounter (Signed)
 Office is requesting a copy of patient last office visit. Fax # [phone]. Please advise

## 2024-04-20 LAB — IGE: IgE (Immunoglobulin E), Serum: 177 kU/L — ABNORMAL HIGH

## 2024-04-20 LAB — D-DIMER, QUANTITATIVE: D-Dimer, Quant: 0.22 ug{FEU}/mL

## 2024-04-20 LAB — ALPHA-1-ANTITRYPSIN PHENOTYP: A-1 Antitrypsin: 176 mg/dL (ref 101–187)

## 2024-04-20 NOTE — Telephone Encounter (Signed)
 Noted. Nothing further needed.

## 2024-04-21 ENCOUNTER — Ambulatory Visit: Payer: Self-pay | Admitting: Internal Medicine

## 2024-04-21 DIAGNOSIS — F17210 Nicotine dependence, cigarettes, uncomplicated: Secondary | ICD-10-CM | POA: Insufficient documentation

## 2024-04-21 NOTE — Assessment & Plan Note (Addendum)
 Active smoker/MM -  04/18/2024  After extensive coaching inhaler device,  effectiveness =    75% >  start breztri  - Allergy screen 04/18/2024 >  Eos 0.0 /  IgE 177 with alpha one AT Phenotype MM / level  = 176  - 04/18/2024   Walked on RA  x  one  lap(s) =  approx 250  ft  @ slow pace, stopped due to leg pain / sob  with lowest 02 sats 99% - PFTs ordered     Group E in terms of symptoms/risk so  laba/lama/ICS  therefore appropriate rx at this point  >>>  brezrtri  and more approp SABA prn.   Comment: Re SABA :  I spent extra time with pt today reviewing appropriate use of albuterol  for prn use on exertion with the following points: 1) saba is for relief of sob that does not improve by walking a slower pace or resting but rather if the pt does not improve after trying this first. 2) If the pt is convinced, as many are, that saba helps recover from activity faster then it's easy to tell if this is the case by re-challenging : ie stop, take the inhaler, then p 5 minutes try the exact same activity (intensity of workload) that just caused the symptoms and see if they are substantially diminished or not after saba 3) if there is an activity that reproducibly causes the symptoms, try the saba 15 min before the activity on alternate days   If in fact the saba really does help, then fine to continue to use it prn but advised may need to look closer at the maintenance regimen (breztri  vs prior rx)  being used to achieve better control of airways disease with exertion.

## 2024-04-21 NOTE — Assessment & Plan Note (Addendum)
 Body mass index is 43.33 kg/m.  -  trending up  Lab Results  Component Value Date   TSH 0.35 04/18/2024    Contributing to doe and risk of GERD/dvt/ PE  >>>   reviewed the need and the process to achieve and maintain neg calorie balance > defer f/u primary care including intermittently monitoring thyroid status       >>>  F/u 4 weeks for pfts and with all meds in hand using a trust but verify approach to confirm accurate Medication  Reconciliation The principal here is that until we are certain that the  patients are doing what we've asked, it makes no sense to ask them to do more.  Each maintenance medication was reviewed in detail including emphasizing most importantly the difference between maintenance and prns and under what circumstances the prns are to be triggered using an action plan format where appropriate.  Total time for H and P, chart review, counseling, reviewing hfa device(s) , directly observing portions of ambulatory 02 saturation study/ and generating customized AVS unique to this office visit / same day charting = 46 min new pt eval

## 2024-04-21 NOTE — Assessment & Plan Note (Addendum)
 5  min discussion re active cigarette smoking in addition to office E&M  Ask about tobacco use:   ongoing  Advise quitting   I took an extended  opportunity with this patient to outline the consequences of continued cigarette use  in airway disorders based on all the data we have from the multiple national lung health studies (perfomed over decades at millions of dollars in cost)  indicating that smoking cessation, not choice of inhalers or pulmonary physicians, is the most important aspect of his  care.   Assess willingness:  Not committed at this point Assist in quit attempt:  Per PCP when ready Arrange follow up:   Follow up per Primary Care planned

## 2024-04-24 ENCOUNTER — Telehealth (HOSPITAL_COMMUNITY): Payer: Self-pay | Admitting: Emergency Medicine

## 2024-04-24 NOTE — Telephone Encounter (Signed)
 Reaching out to patient to offer assistance regarding upcoming cardiac imaging study; pt verbalizes understanding of appt date/time, parking situation and where to check in, pre-test NPO status and medications ordered, and verified current allergies; name and call back number provided for further questions should they arise Rockwell Alexandria RN Navigator Cardiac Imaging [facility] [phone] office [phone] cell

## 2024-04-25 ENCOUNTER — Ambulatory Visit (HOSPITAL_COMMUNITY)
Admission: RE | Admit: 2024-04-25 | Discharge: 2024-04-25 | Disposition: A | Discharge status: Home / Self Care | Source: Ambulatory Visit | Attending: Cardiology | Admitting: Cardiology

## 2024-04-25 DIAGNOSIS — R0602 Shortness of breath: Secondary | ICD-10-CM | POA: Diagnosis present

## 2024-04-25 DIAGNOSIS — I2510 Atherosclerotic heart disease of native coronary artery without angina pectoris: Secondary | ICD-10-CM | POA: Diagnosis not present

## 2024-04-25 MED ORDER — IOHEXOL 350 MG/ML SOLN
100.0000 mL | Freq: Once | INTRAVENOUS | Status: AC | PRN
  Administered 2024-04-25: 100 mL via INTRAVENOUS

## 2024-04-25 MED ORDER — NITROGLYCERIN 0.4 MG SL SUBL
0.8000 mg | SUBLINGUAL_TABLET | Freq: Once | SUBLINGUAL | Status: AC
  Administered 2024-04-25: 0.8 mg via SUBLINGUAL

## 2024-04-26 ENCOUNTER — Ambulatory Visit (HOSPITAL_COMMUNITY)
Admission: RE | Admit: 2024-04-26 | Discharge: 2024-04-26 | Disposition: A | Discharge status: Home / Self Care | Source: Ambulatory Visit | Attending: Internal Medicine | Admitting: Internal Medicine

## 2024-04-26 ENCOUNTER — Other Ambulatory Visit (HOSPITAL_BASED_OUTPATIENT_CLINIC_OR_DEPARTMENT_OTHER): Payer: Self-pay | Admitting: Internal Medicine

## 2024-04-26 ENCOUNTER — Ambulatory Visit (HOSPITAL_BASED_OUTPATIENT_CLINIC_OR_DEPARTMENT_OTHER): Payer: Self-pay | Admitting: Internal Medicine

## 2024-04-26 DIAGNOSIS — R931 Abnormal findings on diagnostic imaging of heart and coronary circulation: Secondary | ICD-10-CM

## 2024-04-26 NOTE — Progress Notes (Signed)
CT sent for FFR 

## 2024-05-02 ENCOUNTER — Ambulatory Visit: Payer: Self-pay | Admitting: Cardiovascular Disease

## 2024-05-02 DIAGNOSIS — I2510 Atherosclerotic heart disease of native coronary artery without angina pectoris: Secondary | ICD-10-CM

## 2024-05-08 MED ORDER — ASPIRIN 81 MG PO TBEC: 81.0000 mg | DELAYED_RELEASE_TABLET | Freq: Every day | ORAL | Status: AC

## 2024-05-16 ENCOUNTER — Other Ambulatory Visit: Payer: Self-pay | Admitting: *Deleted

## 2024-05-16 DIAGNOSIS — I2510 Atherosclerotic heart disease of native coronary artery without angina pectoris: Secondary | ICD-10-CM

## 2024-05-16 LAB — HEPATIC FUNCTION PANEL
ALT: 24 [IU]/L (ref 0–44)
AST: 21 [IU]/L (ref 0–40)
Albumin: 4.3 g/dL (ref 3.8–4.9)
Alkaline Phosphatase: 99 [IU]/L (ref 47–123)
Bilirubin Total: 0.2 mg/dL (ref 0.0–1.2)
Bilirubin, Direct: 0.11 mg/dL (ref 0.00–0.40)
Total Protein: 6.1 g/dL (ref 6.0–8.5)

## 2024-05-16 LAB — LIPID PANEL
Chol/HDL Ratio: 5.3 ratio — ABNORMAL HIGH (ref 0.0–5.0)
Cholesterol, Total: 159 mg/dL (ref 100–199)
HDL: 30 mg/dL — ABNORMAL LOW
LDL Chol Calc (NIH): 104 mg/dL — ABNORMAL HIGH (ref 0–99)
Triglycerides: 141 mg/dL (ref 0–149)
VLDL Cholesterol Cal: 25 mg/dL (ref 5–40)

## 2024-05-17 ENCOUNTER — Ambulatory Visit: Payer: Self-pay | Admitting: Cardiovascular Disease

## 2024-05-17 DIAGNOSIS — E785 Hyperlipidemia, unspecified: Secondary | ICD-10-CM

## 2024-05-17 DIAGNOSIS — Z79899 Other long term (current) drug therapy: Secondary | ICD-10-CM

## 2024-05-17 MED ORDER — ROSUVASTATIN CALCIUM 10 MG PO TABS: 10.0000 mg | ORAL_TABLET | Freq: Every day | ORAL | 3 refills | Status: AC

## 2024-05-22 ENCOUNTER — Encounter

## 2024-05-23 ENCOUNTER — Ambulatory Visit: Admitting: Internal Medicine
